# Patient Record
Sex: Female | Born: 1950 | Race: White | Hispanic: No | Marital: Married | Smoking: Never smoker
Health system: Southern US, Community
[De-identification: ages and names within clinical notes are randomized; demographics above are authoritative.]

## PROBLEM LIST (undated history)

## (undated) DIAGNOSIS — G709 Myoneural disorder, unspecified: Secondary | ICD-10-CM

## (undated) DIAGNOSIS — I2510 Atherosclerotic heart disease of native coronary artery without angina pectoris: Secondary | ICD-10-CM

## (undated) DIAGNOSIS — R209 Unspecified disturbances of skin sensation: Secondary | ICD-10-CM

## (undated) DIAGNOSIS — E559 Vitamin D deficiency, unspecified: Secondary | ICD-10-CM

## (undated) DIAGNOSIS — R7303 Prediabetes: Secondary | ICD-10-CM

## (undated) DIAGNOSIS — G2581 Restless legs syndrome: Secondary | ICD-10-CM

## (undated) DIAGNOSIS — C801 Malignant (primary) neoplasm, unspecified: Secondary | ICD-10-CM

## (undated) DIAGNOSIS — E039 Hypothyroidism, unspecified: Secondary | ICD-10-CM

## (undated) DIAGNOSIS — I700 Atherosclerosis of aorta: Secondary | ICD-10-CM

## (undated) DIAGNOSIS — Z9889 Other specified postprocedural states: Secondary | ICD-10-CM

## (undated) DIAGNOSIS — I10 Essential (primary) hypertension: Secondary | ICD-10-CM

## (undated) DIAGNOSIS — J45909 Unspecified asthma, uncomplicated: Secondary | ICD-10-CM

## (undated) DIAGNOSIS — K5730 Diverticulosis of large intestine without perforation or abscess without bleeding: Secondary | ICD-10-CM

## (undated) DIAGNOSIS — J309 Allergic rhinitis, unspecified: Secondary | ICD-10-CM

## (undated) DIAGNOSIS — K219 Gastro-esophageal reflux disease without esophagitis: Secondary | ICD-10-CM

## (undated) DIAGNOSIS — E785 Hyperlipidemia, unspecified: Secondary | ICD-10-CM

## (undated) HISTORY — DX: Diverticulosis of large intestine without perforation or abscess without bleeding: K57.30

## (undated) HISTORY — DX: Allergic rhinitis, unspecified: J30.9

## (undated) HISTORY — DX: Vitamin D deficiency, unspecified: E55.9

## (undated) HISTORY — DX: Unspecified disturbances of skin sensation: R20.9

## (undated) HISTORY — PX: WISDOM TOOTH EXTRACTION: SHX21

## (undated) HISTORY — DX: Unspecified asthma, uncomplicated: J45.909

## (undated) HISTORY — DX: Atherosclerotic heart disease of native coronary artery without angina pectoris: I25.10

## (undated) HISTORY — PX: COLONOSCOPY: SHX174

## (undated) HISTORY — DX: Essential (primary) hypertension: I10

## (undated) HISTORY — DX: Gastro-esophageal reflux disease without esophagitis: K21.9

## (undated) HISTORY — DX: Restless legs syndrome: G25.81

## (undated) HISTORY — DX: Hypothyroidism, unspecified: E03.9

## (undated) HISTORY — DX: Hyperlipidemia, unspecified: E78.5

## (undated) HISTORY — DX: Atherosclerosis of aorta: I70.0

---

## 1974-07-06 HISTORY — PX: TUBAL LIGATION: SHX77

## 1997-07-06 HISTORY — PX: TONSILLECTOMY AND ADENOIDECTOMY: SUR1326

## 1997-07-06 HISTORY — PX: THYROIDECTOMY: SHX17

## 1998-06-12 ENCOUNTER — Encounter: Admission: RE | Admit: 1998-06-12 | Discharge: 1998-06-12 | Payer: Self-pay | Admitting: *Deleted

## 1998-06-14 ENCOUNTER — Encounter: Payer: Self-pay | Admitting: Internal Medicine

## 1998-06-14 ENCOUNTER — Ambulatory Visit (HOSPITAL_COMMUNITY): Admission: RE | Admit: 1998-06-14 | Discharge: 1998-06-14 | Payer: Self-pay | Admitting: Internal Medicine

## 1998-07-15 ENCOUNTER — Ambulatory Visit (HOSPITAL_COMMUNITY): Admission: RE | Admit: 1998-07-15 | Discharge: 1998-07-16 | Payer: Self-pay | Admitting: *Deleted

## 2001-09-13 ENCOUNTER — Encounter: Admission: RE | Admit: 2001-09-13 | Discharge: 2001-09-13 | Payer: Self-pay | Admitting: Internal Medicine

## 2001-09-13 ENCOUNTER — Encounter: Payer: Self-pay | Admitting: Internal Medicine

## 2003-06-27 ENCOUNTER — Other Ambulatory Visit: Admission: RE | Admit: 2003-06-27 | Discharge: 2003-06-27 | Payer: Self-pay | Admitting: Internal Medicine

## 2004-04-01 ENCOUNTER — Ambulatory Visit (HOSPITAL_COMMUNITY): Admission: RE | Admit: 2004-04-01 | Discharge: 2004-04-01 | Payer: Self-pay | Admitting: Internal Medicine

## 2004-06-25 ENCOUNTER — Ambulatory Visit: Payer: Self-pay | Admitting: Internal Medicine

## 2004-07-02 ENCOUNTER — Ambulatory Visit: Payer: Self-pay | Admitting: Internal Medicine

## 2004-09-10 ENCOUNTER — Ambulatory Visit: Payer: Self-pay | Admitting: Gastroenterology

## 2004-09-18 ENCOUNTER — Ambulatory Visit: Payer: Self-pay | Admitting: Gastroenterology

## 2005-01-27 ENCOUNTER — Ambulatory Visit: Payer: Self-pay | Admitting: Internal Medicine

## 2005-05-11 ENCOUNTER — Ambulatory Visit: Payer: Self-pay | Admitting: Internal Medicine

## 2005-06-19 ENCOUNTER — Ambulatory Visit: Payer: Self-pay | Admitting: Internal Medicine

## 2005-06-25 ENCOUNTER — Encounter: Payer: Self-pay | Admitting: Internal Medicine

## 2005-06-25 ENCOUNTER — Ambulatory Visit: Payer: Self-pay | Admitting: Internal Medicine

## 2005-06-25 ENCOUNTER — Other Ambulatory Visit: Admission: RE | Admit: 2005-06-25 | Discharge: 2005-06-25 | Payer: Self-pay | Admitting: Internal Medicine

## 2005-08-11 ENCOUNTER — Ambulatory Visit (HOSPITAL_COMMUNITY): Admission: RE | Admit: 2005-08-11 | Discharge: 2005-08-11 | Payer: Self-pay | Admitting: Internal Medicine

## 2006-06-28 ENCOUNTER — Ambulatory Visit: Payer: Self-pay | Admitting: Internal Medicine

## 2006-06-28 LAB — CONVERTED CEMR LAB
Albumin: 3.5 g/dL (ref 3.5–5.2)
Alkaline Phosphatase: 99 units/L (ref 39–117)
Basophils Absolute: 0 10*3/uL (ref 0.0–0.1)
CO2: 32 meq/L (ref 19–32)
Calcium: 9 mg/dL (ref 8.4–10.5)
Chol/HDL Ratio, serum: 4.9
Glomerular Filtration Rate, Af Am: 95 mL/min/{1.73_m2}
Glucose, Bld: 104 mg/dL — ABNORMAL HIGH (ref 70–99)
LDL Cholesterol: 125 mg/dL — ABNORMAL HIGH (ref 0–99)
Lymphocytes Relative: 22.5 % (ref 12.0–46.0)
MCHC: 34.5 g/dL (ref 30.0–36.0)
Monocytes Absolute: 0.4 10*3/uL (ref 0.2–0.7)
Monocytes Relative: 8.5 % (ref 3.0–11.0)
Neutro Abs: 2.9 10*3/uL (ref 1.4–7.7)
Platelets: 194 10*3/uL (ref 150–400)
Potassium: 3.9 meq/L (ref 3.5–5.1)
TSH: 0.88 microintl units/mL (ref 0.35–5.50)
Total Protein: 6.7 g/dL (ref 6.0–8.3)

## 2006-07-05 ENCOUNTER — Ambulatory Visit: Payer: Self-pay | Admitting: Internal Medicine

## 2006-08-12 ENCOUNTER — Ambulatory Visit (HOSPITAL_COMMUNITY): Admission: RE | Admit: 2006-08-12 | Discharge: 2006-08-12 | Payer: Self-pay | Admitting: Internal Medicine

## 2007-06-29 ENCOUNTER — Ambulatory Visit: Payer: Self-pay | Admitting: Internal Medicine

## 2007-06-29 LAB — CONVERTED CEMR LAB
ALT: 34 units/L (ref 0–35)
Alkaline Phosphatase: 106 units/L (ref 39–117)
Basophils Relative: 0.1 % (ref 0.0–1.0)
Bilirubin Urine: NEGATIVE
Bilirubin, Direct: 0.2 mg/dL (ref 0.0–0.3)
CO2: 31 meq/L (ref 19–32)
Creatinine, Ser: 0.7 mg/dL (ref 0.4–1.2)
Eosinophils Relative: 2.7 % (ref 0.0–5.0)
GFR calc Af Amer: 111 mL/min
Glucose, Bld: 105 mg/dL — ABNORMAL HIGH (ref 70–99)
HCT: 40.5 % (ref 36.0–46.0)
Hemoglobin: 13.9 g/dL (ref 12.0–15.0)
Lymphocytes Relative: 21.6 % (ref 12.0–46.0)
Monocytes Absolute: 0.5 10*3/uL (ref 0.2–0.7)
Neutro Abs: 3.7 10*3/uL (ref 1.4–7.7)
Nitrite: NEGATIVE
Potassium: 4.1 meq/L (ref 3.5–5.1)
RDW: 12 % (ref 11.5–14.6)
TSH: 0.7 microintl units/mL (ref 0.35–5.50)
Total Bilirubin: 0.8 mg/dL (ref 0.3–1.2)
Total CHOL/HDL Ratio: 5.6
Total Protein: 7 g/dL (ref 6.0–8.3)
Urobilinogen, UA: 0.2
VLDL: 29 mg/dL (ref 0–40)
WBC Urine, dipstick: NEGATIVE
WBC: 5.5 10*3/uL (ref 4.5–10.5)

## 2007-07-06 DIAGNOSIS — K5730 Diverticulosis of large intestine without perforation or abscess without bleeding: Secondary | ICD-10-CM

## 2007-07-06 DIAGNOSIS — I10 Essential (primary) hypertension: Secondary | ICD-10-CM

## 2007-07-06 DIAGNOSIS — E039 Hypothyroidism, unspecified: Secondary | ICD-10-CM

## 2007-07-06 DIAGNOSIS — K219 Gastro-esophageal reflux disease without esophagitis: Secondary | ICD-10-CM

## 2007-07-06 HISTORY — DX: Essential (primary) hypertension: I10

## 2007-07-06 HISTORY — DX: Hypothyroidism, unspecified: E03.9

## 2007-07-06 HISTORY — DX: Gastro-esophageal reflux disease without esophagitis: K21.9

## 2007-07-06 HISTORY — DX: Diverticulosis of large intestine without perforation or abscess without bleeding: K57.30

## 2007-07-11 ENCOUNTER — Ambulatory Visit: Payer: Self-pay | Admitting: Internal Medicine

## 2007-07-11 DIAGNOSIS — J309 Allergic rhinitis, unspecified: Secondary | ICD-10-CM

## 2007-07-11 DIAGNOSIS — J45909 Unspecified asthma, uncomplicated: Secondary | ICD-10-CM | POA: Insufficient documentation

## 2007-07-11 HISTORY — DX: Allergic rhinitis, unspecified: J30.9

## 2007-08-11 ENCOUNTER — Ambulatory Visit: Payer: Self-pay | Admitting: Internal Medicine

## 2007-08-11 DIAGNOSIS — T887XXA Unspecified adverse effect of drug or medicament, initial encounter: Secondary | ICD-10-CM | POA: Insufficient documentation

## 2007-09-06 ENCOUNTER — Ambulatory Visit (HOSPITAL_COMMUNITY): Admission: RE | Admit: 2007-09-06 | Discharge: 2007-09-06 | Payer: Self-pay | Admitting: Internal Medicine

## 2007-10-10 ENCOUNTER — Ambulatory Visit: Payer: Self-pay | Admitting: Internal Medicine

## 2007-10-20 ENCOUNTER — Encounter: Payer: Self-pay | Admitting: Internal Medicine

## 2008-07-31 ENCOUNTER — Ambulatory Visit: Payer: Self-pay | Admitting: Internal Medicine

## 2008-07-31 LAB — CONVERTED CEMR LAB
ALT: 34 units/L (ref 0–35)
AST: 31 units/L (ref 0–37)
Albumin: 3.7 g/dL (ref 3.5–5.2)
Alkaline Phosphatase: 88 units/L (ref 39–117)
BUN: 15 mg/dL (ref 6–23)
Basophils Relative: 0.8 % (ref 0.0–3.0)
CO2: 28 meq/L (ref 19–32)
Chloride: 104 meq/L (ref 96–112)
Creatinine, Ser: 0.8 mg/dL (ref 0.4–1.2)
Eosinophils Absolute: 0.1 10*3/uL (ref 0.0–0.7)
Eosinophils Relative: 2.7 % (ref 0.0–5.0)
GFR calc non Af Amer: 78 mL/min
Glucose, Bld: 98 mg/dL (ref 70–99)
Glucose, Urine, Semiquant: NEGATIVE
HDL: 37.8 mg/dL — ABNORMAL LOW (ref >39.0)
LDL Cholesterol: 126 mg/dL — ABNORMAL HIGH (ref 0–99)
Lymphocytes Relative: 27.8 % (ref 12.0–46.0)
MCV: 91.2 fL (ref 78.0–100.0)
Monocytes Relative: 8.3 % (ref 3.0–12.0)
Neutrophils Relative %: 60.4 % (ref 43.0–77.0)
Nitrite: NEGATIVE
Platelets: 158 10*3/uL (ref 150–400)
Potassium: 3.4 meq/L — ABNORMAL LOW (ref 3.5–5.1)
RBC: 4.14 M/uL (ref 3.87–5.11)
TSH: 0.79 microintl units/mL (ref 0.35–5.50)
Total CHOL/HDL Ratio: 5.1
VLDL: 29 mg/dL (ref 0–40)
WBC Urine, dipstick: NEGATIVE
WBC: 4.2 10*3/uL — ABNORMAL LOW (ref 4.5–10.5)
pH: 7

## 2008-08-07 ENCOUNTER — Other Ambulatory Visit: Admission: RE | Admit: 2008-08-07 | Discharge: 2008-08-07 | Payer: Self-pay | Admitting: Internal Medicine

## 2008-08-07 ENCOUNTER — Encounter: Payer: Self-pay | Admitting: Internal Medicine

## 2008-08-07 ENCOUNTER — Ambulatory Visit: Payer: Self-pay | Admitting: Internal Medicine

## 2008-08-07 DIAGNOSIS — G2581 Restless legs syndrome: Secondary | ICD-10-CM

## 2008-08-07 DIAGNOSIS — R51 Headache: Secondary | ICD-10-CM

## 2008-08-07 DIAGNOSIS — R519 Headache, unspecified: Secondary | ICD-10-CM | POA: Insufficient documentation

## 2008-08-07 HISTORY — DX: Restless legs syndrome: G25.81

## 2008-08-09 ENCOUNTER — Telehealth (INDEPENDENT_AMBULATORY_CARE_PROVIDER_SITE_OTHER): Payer: Self-pay | Admitting: *Deleted

## 2008-09-20 ENCOUNTER — Ambulatory Visit (HOSPITAL_COMMUNITY): Admission: RE | Admit: 2008-09-20 | Discharge: 2008-09-20 | Payer: Self-pay | Admitting: Internal Medicine

## 2008-12-11 ENCOUNTER — Ambulatory Visit: Payer: Self-pay | Admitting: Internal Medicine

## 2009-05-09 ENCOUNTER — Telehealth: Payer: Self-pay | Admitting: Internal Medicine

## 2009-06-17 ENCOUNTER — Encounter: Payer: Self-pay | Admitting: Internal Medicine

## 2009-08-19 ENCOUNTER — Ambulatory Visit: Payer: Self-pay | Admitting: Internal Medicine

## 2009-08-19 LAB — CONVERTED CEMR LAB
ALT: 39 units/L — ABNORMAL HIGH (ref 0–35)
AST: 36 units/L (ref 0–37)
Albumin: 4.1 g/dL (ref 3.5–5.2)
BUN: 16 mg/dL (ref 6–23)
Chloride: 107 meq/L (ref 96–112)
Direct LDL: 142.2 mg/dL
Eosinophils Relative: 3.4 % (ref 0.0–5.0)
Glucose, Bld: 89 mg/dL (ref 70–99)
Glucose, Urine, Semiquant: NEGATIVE
HCT: 39.4 % (ref 36.0–46.0)
Hemoglobin: 13.2 g/dL (ref 12.0–15.0)
Lymphs Abs: 1.4 10*3/uL (ref 0.7–4.0)
MCV: 94.3 fL (ref 78.0–100.0)
Monocytes Absolute: 0.4 10*3/uL (ref 0.1–1.0)
Neutro Abs: 2.7 10*3/uL (ref 1.4–7.7)
Nitrite: NEGATIVE
Platelets: 154 10*3/uL (ref 150.0–400.0)
Potassium: 4.5 meq/L (ref 3.5–5.1)
RDW: 12.3 % (ref 11.5–14.6)
Sodium: 145 meq/L (ref 135–145)
Specific Gravity, Urine: >=1.03
TSH: 0.41 microintl units/mL (ref 0.35–5.50)
Total Bilirubin: 0.5 mg/dL (ref 0.3–1.2)
WBC Urine, dipstick: NEGATIVE
WBC: 4.8 10*3/uL (ref 4.5–10.5)
pH: 5.5

## 2009-08-26 ENCOUNTER — Ambulatory Visit: Payer: Self-pay | Admitting: Internal Medicine

## 2010-02-13 ENCOUNTER — Ambulatory Visit: Payer: Self-pay | Admitting: Family Medicine

## 2010-02-13 DIAGNOSIS — R209 Unspecified disturbances of skin sensation: Secondary | ICD-10-CM | POA: Insufficient documentation

## 2010-02-13 DIAGNOSIS — M7660 Achilles tendinitis, unspecified leg: Secondary | ICD-10-CM

## 2010-02-13 HISTORY — DX: Unspecified disturbances of skin sensation: R20.9

## 2010-02-27 ENCOUNTER — Ambulatory Visit (HOSPITAL_COMMUNITY): Admission: RE | Admit: 2010-02-27 | Discharge: 2010-02-27 | Payer: Self-pay | Admitting: Internal Medicine

## 2010-02-27 LAB — HM MAMMOGRAPHY: HM Mammogram: NEGATIVE

## 2010-03-26 ENCOUNTER — Telehealth: Payer: Self-pay | Admitting: Internal Medicine

## 2010-07-27 ENCOUNTER — Encounter: Payer: Self-pay | Admitting: Internal Medicine

## 2010-08-07 NOTE — Progress Notes (Signed)
Summary: [facility] called req clarification on Indomethacin  Phone Note From Pharmacy Call back at [phone] [facility]   Caller: [facility] [address] Summary of Call: Need clarification on Indomethacin 25mg  caps as needed.  Initial call taken by: Lucy Antigua,  March 26, 2010 9:49 AM  Follow-up for Phone Call        spoke with pharmacy - gave new sig. KIK  med list changed Follow-up by: Duard Brady LPN,  March 26, 2010 10:06 AM    New/Updated Medications: INDOMETHACIN 25 MG CAPS (INDOMETHACIN) 1 by mouth once daily or two times a day as needed pain

## 2010-08-07 NOTE — Assessment & Plan Note (Signed)
Summary: not feeling well//ccm   Vital Signs:  Patient profile:   60 year old female Weight:      194 pounds O2 Sat:      97 % on Room air Temp:     98.6 degrees F oral Pulse rate:   78 / minute BP sitting:   120 / 80  (left arm) Cuff size:   regular  Vitals Entered By: Romualdo Bolk, CMA (AAMA) (February 13, 2010 11:35 AM)  O2 Flow:  Room air CC: Left ankle swelling for a few months, fatigue, sob and arms going numb. Pt states that she is not having any chest pain.   History of Present Illness: Patient seen for same day appt with chief complaint of left ankle swelling for past few months. She just returned from [place] and did more walking than usual. Minimal if any discomfort other than around the Achilles region. No specific injury. No weakness in the foot or ankle. She has not tried icing. Not taking any medications. Aggravated by prolonged standing or walking.  she has noticed some swelling both on the medial and lateral aspect of the ankle.  Patient complains of some intermittent numbness involving both upper extremities. This occurs almost exclusively at night and usually is very transient and improves after position change. No associated weakness. No neck pain.    Preventive Screening-Counseling & Management  Alcohol-Tobacco     Smoking Status: never  Current Medications (verified): 1)  Synthroid 50 Mcg  Tabs (Levothyroxine Sodium) .Marland Kitchen.. 1 Once Daily 2)  Indomethacin 25 Mg Caps (Indomethacin) .... Prn 3)  Hydrochlorothiazide 25 Mg Tabs (Hydrochlorothiazide) .... One Daily  Allergies (verified): 1)  ! Codeine Phosphate (Codeine Phosphate)  Past History:  Past Medical History: Last updated: 07/11/2007 Hypertension Hypothyroidism Restless legs Diverticulosis, colon GERD Allergic rhinitis Asthma  Past Surgical History: Last updated: 08/26/2009 Tubal ligation Thyroidectomy, partail colonoscopy  2007  Social History: Last updated: 07/11/2007 one son one  daughter, 5 grandchildren PMH reviewed for relevance, PSH reviewed for relevance, SH/Risk Factors reviewed for relevance  Review of Systems  The patient denies anorexia, fever, weight loss, weight gain, dyspnea on exertion, prolonged cough, muscle weakness, and difficulty walking.    Physical Exam  General:  Well-developed,well-nourished,in no acute distress; alert,appropriate and cooperative throughout examination Mouth:  Oral mucosa and oropharynx without lesions or exudates.  Teeth in good repair. Neck:  No deformities, masses, or tenderness noted. Lungs:  Normal respiratory effort, chest expands symmetrically. Lungs are clear to auscultation, no crackles or wheezes. Heart:  Normal rate and regular rhythm. S1 and S2 normal without gallop, murmur, click, rub or other extra sounds. Extremities:  left ankle reveals some mild swelling around the distal Achilles region but tendon fully intact. She has full range of motion with plantar flexion and dorsiflexion. She has some slight soft tissue swelling but none tenderness posterior to the medial or lateral malleolus. No erythema or warmth. No bony tenderness in the foot. Good distal foot pulses Neurologic:  full strength with plantar and dorsiflexion.alert & oriented X3, cranial nerves II-XII intact, strength normal in all extremities, sensation intact to light touch, and DTRs symmetrical and normal.   Skin:  no rashes.     Impression & Recommendations:  Problem # 1:  ACHILLES TENDINITIS (ICD-726.71) Assessment New we've recommended icing, over-the-counter anti-inflammatories and stretches given. Consider elastic sleeve for compression if swelling persist.  Problem # 2:  PARESTHESIA (ICD-782.0) Assessment: New suspect this is positional since symptoms very transient at night.  No associated weakness. Reassurance given.  Complete Medication List: 1)  Synthroid 50 Mcg Tabs (Levothyroxine sodium) .Marland Kitchen.. 1 once daily 2)  Indomethacin 25 Mg Caps  (Indomethacin) .... Prn 3)  Hydrochlorothiazide 25 Mg Tabs (Hydrochlorothiazide) .... One daily  Patient Instructions: 1)  Ice L ankle 2-3 times daily if possible. 2)  Achilles stretches as instructed. 3)  Elevated feet frequently. 4)  Consider elastic brace to L ankle.

## 2010-08-07 NOTE — Assessment & Plan Note (Signed)
Summary: CPX // RS   Vital Signs:  Patient profile:   60 year old female Height:      63 inches Weight:      192 pounds BMI:     34.13 Temp:     98.4 degrees F oral BP sitting:   120 / 70  (right arm) Cuff size:   regular  Vitals Entered By: Duard Brady LPN (August 26, 2009 1:17 PM) CC: CPX - doing ok  Is Patient Diabetic? No   CC:  CPX - doing ok .  History of Present Illness: 60 -year-old patient who is seen today for a preventive health examination.  Medical comes include mild hypertension.  She has a history of asthma, allergic rhinitis, and gastroesophageal reflux disease.  She has restless leg syndrome.  [facility] headaches, which have improved.  No new concerns or complaints today.  Laboratory studies reviewed.  She does complain of some mild, but persistent cough for several months.  She is on ACE inhibition.  Preventive Screening-Counseling & Management  Alcohol-Tobacco     Smoking Status: never  Allergies: 1)  ! Codeine Phosphate (Codeine Phosphate)  Past History:  Past Medical History: Reviewed history from 07/11/2007 and no changes required. Hypertension Hypothyroidism Restless legs Diverticulosis, colon GERD Allergic rhinitis Asthma  Past Surgical History: Tubal ligation Thyroidectomy, partail colonoscopy  2007  Family History: Reviewed history from 07/11/2007 and no changes required. father age 18 in good health.  Mother died at 60, heart failure, history of COPD, maternal aunt had breast cancer.  Maternal uncle lung cancer  Two brothers, one with a history of hypertension  Social History: Reviewed history from 07/11/2007 and no changes required. one son one daughter, 5 grandchildrenSmoking Status:  never  Review of Systems       The patient complains of prolonged cough.  The patient denies anorexia, fever, weight loss, weight gain, vision loss, decreased hearing, hoarseness, chest pain, syncope,  dyspnea on exertion, peripheral edema, headaches, hemoptysis, abdominal pain, melena, hematochezia, severe indigestion/heartburn, hematuria, incontinence, genital sores, muscle weakness, suspicious skin lesions, transient blindness, difficulty walking, depression, unusual weight change, abnormal bleeding, enlarged lymph nodes, angioedema, and breast masses.    Physical Exam  General:  overweight-appearing.  110/70overweight-appearing.   Head:  Normocephalic and atraumatic without obvious abnormalities. No apparent alopecia or balding. Eyes:  No corneal or conjunctival inflammation noted. EOMI. Perrla. Funduscopic exam benign, without hemorrhages, exudates or papilledema. Vision grossly normal. Ears:  External ear exam shows no significant lesions or deformities.  Otoscopic examination reveals clear canals, tympanic membranes are intact bilaterally without bulging, retraction, inflammation or discharge. Hearing is grossly normal bilaterally. Nose:  External nasal examination shows no deformity or inflammation. Nasal mucosa are pink and moist without lesions or exudates. Mouth:  Oral mucosa and oropharynx without lesions or exudates.  Teeth in good repair. Neck:  No deformities, masses, or tenderness noted. Chest Wall:  No deformities, masses, or tenderness noted. Breasts:  No mass, nodules, thickening, tenderness, bulging, retraction, inflamation, nipple discharge or skin changes noted.   Lungs:  Normal respiratory effort, chest expands symmetrically. Lungs are clear to auscultation, no crackles or wheezes. Heart:  Normal rate and regular rhythm. S1 and S2 normal without gallop, murmur, click, rub or other extra sounds. Rectal:  No external abnormalities noted. Normal sphincter tone. No rectal masses or tenderness. Genitalia:  Normal introitus for age, no external lesions, no vaginal discharge, mucosa pink and moist, no vaginal or cervical lesions,  no vaginal atrophy, no friaility or hemorrhage,  normal uterus size and position, no adnexal masses or tenderness Msk:  No deformity or scoliosis noted of thoracic or lumbar spine.   Pulses:  the right posterior tibial pulse was diminished Extremities:  No clubbing, cyanosis, edema, or deformity noted with normal full range of motion of all joints.   Neurologic:  No cranial nerve deficits noted. Station and gait are normal. Plantar reflexes are down-going bilaterally. DTRs are symmetrical throughout. Sensory, motor and coordinative functions appear intact. Skin:  Intact without suspicious lesions or rashes Cervical Nodes:  No lymphadenopathy noted Axillary Nodes:  No palpable lymphadenopathy Inguinal Nodes:  No significant adenopathy Psych:  Cognition and judgment appear intact. Alert and cooperative with normal attention span and concentration. No apparent delusions, illusions, hallucinations   Impression & Recommendations:  Problem # 1:  HEADACHE (ICD-784.0)  Her updated medication list for this problem includes:    Indomethacin 25 Mg Caps (Indomethacin) .Marland Kitchen... Prn  Orders: EKG w/ Interpretation (93000)  Her updated medication list for this problem includes:    Indomethacin 25 Mg Caps (Indomethacin) .Marland Kitchen... Prn  Problem # 2:  RESTLESS LEG SYNDROME (ICD-333.94)  Problem # 3:  GERD (ICD-530.81)  Problem # 4:  ASTHMA (ICD-493.90)  Complete Medication List: 1)  Synthroid 50 Mcg Tabs (Levothyroxine sodium) .Marland Kitchen.. 1 once daily 2)  Requip 1 Mg Tabs (Ropinirole hcl) .Marland Kitchen.. 1 at bedtime 3)  Indomethacin 25 Mg Caps (Indomethacin) .... Prn 4)  Hydrochlorothiazide 25 Mg Tabs (Hydrochlorothiazide) .... One daily  Patient Instructions: 1)  Limit your Sodium (Salt). 2)  It is important that you exercise regularly at least 20 minutes 5 times a week. If you develop chest pain, have severe difficulty breathing, or feel very tired , stop exercising immediately and seek medical attention. 3)  You need to lose weight. Consider a lower calorie diet  and regular exercise.  4)  Check your Blood Pressure regularly. If it is above:  150/90 you should make an appointment. Prescriptions: HYDROCHLOROTHIAZIDE 25 MG TABS (HYDROCHLOROTHIAZIDE) one daily  #90 x 6   Entered and Authorized by:   Gordy Savers  MD   Signed by:   Gordy Savers  MD on 08/26/2009   Method used:   Print then Give to Patient   RxID:   [id number] INDOMETHACIN 25 MG CAPS (INDOMETHACIN) prn  #50 x 3   Entered and Authorized by:   Gordy Savers  MD   Signed by:   Gordy Savers  MD on 08/26/2009   Method used:   Print then Give to Patient   RxID:   [id number] REQUIP 1 MG  TABS (ROPINIROLE HCL) 1 at bedtime  #90 x 6   Entered and Authorized by:   Gordy Savers  MD   Signed by:   Gordy Savers  MD on 08/26/2009   Method used:   Print then Give to Patient   RxID:   [id number] SYNTHROID 50 MCG  TABS (LEVOTHYROXINE SODIUM) 1 once daily  #90 x 6   Entered and Authorized by:   Gordy Savers  MD   Signed by:   Gordy Savers  MD on 08/26/2009   Method used:   Print then Give to Patient   RxID:   [id number]

## 2010-09-30 ENCOUNTER — Other Ambulatory Visit: Payer: Self-pay

## 2010-10-07 ENCOUNTER — Other Ambulatory Visit (INDEPENDENT_AMBULATORY_CARE_PROVIDER_SITE_OTHER): Payer: BC Managed Care – PPO | Admitting: Internal Medicine

## 2010-10-07 ENCOUNTER — Encounter: Payer: Self-pay | Admitting: Internal Medicine

## 2010-10-07 DIAGNOSIS — Z0000 Encounter for general adult medical examination without abnormal findings: Secondary | ICD-10-CM

## 2010-10-07 LAB — POCT URINALYSIS DIPSTICK
Glucose, UA: NEGATIVE
Nitrite, UA: NEGATIVE
Protein, UA: NEGATIVE
Spec Grav, UA: 1.015
Urobilinogen, UA: 0.2

## 2010-10-07 LAB — CBC WITH DIFFERENTIAL/PLATELET
Basophils Relative: 0.6 % (ref 0.0–3.0)
Eosinophils Relative: 4.2 % (ref 0.0–5.0)
Lymphocytes Relative: 26.8 % (ref 12.0–46.0)
MCV: 90.8 fl (ref 78.0–100.0)
Monocytes Absolute: 0.4 10*3/uL (ref 0.1–1.0)
Monocytes Relative: 9.2 % (ref 3.0–12.0)
Neutrophils Relative %: 59.2 % (ref 43.0–77.0)
Platelets: 150 10*3/uL (ref 150.0–400.0)
RBC: 4.22 Mil/uL (ref 3.87–5.11)
WBC: 4.3 10*3/uL — ABNORMAL LOW (ref 4.5–10.5)

## 2010-10-08 ENCOUNTER — Encounter: Payer: Self-pay | Admitting: Internal Medicine

## 2010-10-08 LAB — HEPATIC FUNCTION PANEL
ALT: 39 U/L — ABNORMAL HIGH (ref 0–35)
AST: 34 U/L (ref 0–37)
Alkaline Phosphatase: 88 U/L (ref 39–117)
Bilirubin, Direct: 0.1 mg/dL (ref 0.0–0.3)
Total Bilirubin: 0.4 mg/dL (ref 0.3–1.2)
Total Protein: 6.7 g/dL (ref 6.0–8.3)

## 2010-10-08 LAB — LIPID PANEL
LDL Cholesterol: 122 mg/dL — ABNORMAL HIGH (ref 0–99)
Total CHOL/HDL Ratio: 5
Triglycerides: 135 mg/dL (ref 0.0–149.0)

## 2010-10-08 LAB — BASIC METABOLIC PANEL
BUN: 13 mg/dL (ref 6–23)
Calcium: 9.2 mg/dL (ref 8.4–10.5)
Chloride: 105 mEq/L (ref 96–112)
Creatinine, Ser: 0.6 mg/dL (ref 0.4–1.2)
GFR: 100.06 mL/min (ref >60.00)

## 2010-10-08 LAB — TSH: TSH: 0.46 u[IU]/mL (ref 0.35–5.50)

## 2010-10-14 ENCOUNTER — Ambulatory Visit (INDEPENDENT_AMBULATORY_CARE_PROVIDER_SITE_OTHER): Payer: BC Managed Care – PPO | Admitting: Internal Medicine

## 2010-10-14 ENCOUNTER — Encounter: Payer: Self-pay | Admitting: Internal Medicine

## 2010-10-14 DIAGNOSIS — K219 Gastro-esophageal reflux disease without esophagitis: Secondary | ICD-10-CM

## 2010-10-14 DIAGNOSIS — E039 Hypothyroidism, unspecified: Secondary | ICD-10-CM

## 2010-10-14 DIAGNOSIS — R079 Chest pain, unspecified: Secondary | ICD-10-CM

## 2010-10-14 DIAGNOSIS — I10 Essential (primary) hypertension: Secondary | ICD-10-CM

## 2010-10-14 DIAGNOSIS — R0602 Shortness of breath: Secondary | ICD-10-CM

## 2010-10-14 DIAGNOSIS — Z0000 Encounter for general adult medical examination without abnormal findings: Secondary | ICD-10-CM

## 2010-10-14 MED ORDER — LEVOTHYROXINE SODIUM 50 MCG PO TABS: 50.0000 ug | ORAL_TABLET | Freq: Every day | ORAL | Status: DC

## 2010-10-14 MED ORDER — HYDROCHLOROTHIAZIDE 25 MG PO TABS: 25.0000 mg | ORAL_TABLET | Freq: Every day | ORAL | Status: DC

## 2010-10-14 NOTE — Patient Instructions (Signed)
Limit your sodium (Salt) intake  Avoids foods high in acid such as tomatoes citrus juices, and spicy foods.  Avoid eating within two hours of lying down or before exercising.  Do not overheat.  Try smaller more frequent meals.  If symptoms persist, elevate the head of her bed 12 inches while sleeping.  Please check your blood pressure on a regular basis.  If it is consistently greater than 150/90, please make an office appointment.  You need to lose weight.  Consider a lower calorie diet and regular exercise.  Return in 6 months for follow-up  Take a calcium supplement, plus [phone] units of vitamin D

## 2010-10-14 NOTE — Progress Notes (Signed)
Subjective:    Patient ID: Karen Cordova, female    DOB: 24-Jun-1951, 60 y.o.   MRN: [id number]  HPI  60 year old patient who is seen today for an annual examination. She has treated hypertension controlled on diuretic therapy. She does monitor home blood pressure is occasionally with last readings. Complaints today include some left lateral ankle pain this has been present for some months. She does walk daily and it does not interfere with her activities. She does describe some dyspnea on exertion when she walks up hills. Denies any exertional chest pain. She has treated hypothyroidism. She also describes some reflux symptoms but has not tried any medications. An anti-reflux regimen discussed today.    Summary: CPX // RS  Vital Signs:  Patient profile: 60 year old female  Height: 63 inches  Weight: 192 pounds  BMI: 34.13  Temp: 98.4 degrees F oral  BP sitting: 120 / 70 (right arm)  Cuff size: regular  Vitals Entered By: Duard Brady LPN (August 26, 2009 1:17 PM)  CC: CPX - doing ok  Is Patient Diabetic? No  CC: CPX - doing ok .  History of Present Illness:  33 -year-old patient who is seen today for a preventive health examination. Medical comes include mild hypertension. She has a history of asthma, allergic rhinitis, and gastroesophageal reflux disease. She has restless leg syndrome. [facility] for headaches, which have improved. No new concerns or complaints today. Laboratory studies reviewed. She does complain of some mild, but persistent cough for several months. She is on ACE inhibition.  Preventive Screening-Counseling & Management  Alcohol-Tobacco  Smoking Status: never  Allergies:  1) ! Codeine Phosphate (Codeine Phosphate)  Past History:  Past Medical History:  Reviewed history from 07/11/2007 and no changes required.  Hypertension  Hypothyroidism  Restless legs  Diverticulosis, colon  GERD  Allergic rhinitis  Asthma  Past Surgical  History:  Tubal ligation  Thyroidectomy, partail  colonoscopy 2007  Family History:  Reviewed history from 07/11/2007 and no changes required.  father age 69 in good health. Mother died at 22, heart failure, history of COPD, maternal aunt had breast cancer. Maternal uncle lung cancer  Two brothers, one with a history of hypertension  Social History:  Reviewed history from 07/11/2007 and no changes required.  one son one daughter, 5 grandchildrenSmoking Status: never  Review of Systems  The patient complains of prolonged cough. The patient denies anorexia, fever, weight loss, weight gain, vision loss, decreased hearing, hoarseness, chest pain, syncope, dyspnea on exertion, peripheral edema, headaches, hemoptysis, abdominal pain, melena, hematochezia, severe indigestion/heartburn, hematuria, incontinence, genital sores, muscle weakness, suspicious skin lesions, transient blindness, difficulty walking, depression, unusual weight change, abnormal bleeding, enlarged lymph nodes, angioedema, and breast masses.    Past Medical History  Diagnosis Date  . ALLERGIC RHINITIS 07/11/2007  . ASTHMA 07/11/2007  . DIVERTICULOSIS, COLON 07/06/2007  . GERD 07/06/2007  . HYPERTENSION 07/06/2007  . HYPOTHYROIDISM 07/06/2007  . PARESTHESIA 02/13/2010  . RESTLESS LEG SYNDROME 08/07/2008   Past Surgical History  Procedure Date  . Tubal ligation   . Thyroidectomy     partial    reports that she has never smoked. She has never used smokeless tobacco. She reports that she does not drink alcohol or use illicit drugs. family history is not on file. Allergies  Allergen Reactions  . Codeine Phosphate      Review of Systems  Constitutional: Negative for fever, appetite change, fatigue and unexpected weight change.  HENT: Negative for hearing loss, ear pain, nosebleeds, congestion, sore throat, mouth sores, trouble swallowing, neck stiffness, dental problem, voice change, sinus pressure and tinnitus.   Eyes:  Negative for photophobia, pain, redness and visual disturbance.  Respiratory: Negative for cough, chest tightness and shortness of breath.   Cardiovascular: Negative for chest pain, palpitations and leg swelling.  Gastrointestinal: Negative for nausea, vomiting, abdominal pain, diarrhea, constipation, blood in stool, abdominal distention and rectal pain.  Genitourinary: Negative for dysuria, urgency, frequency, hematuria, flank pain, vaginal bleeding, vaginal discharge, difficulty urinating, genital sores, vaginal pain, menstrual problem and pelvic pain.  Musculoskeletal: Negative for back pain and arthralgias.  Skin: Negative for rash.  Neurological: Negative for dizziness, syncope, speech difficulty, weakness, light-headedness, numbness and headaches.  Hematological: Negative for adenopathy. Does not bruise/bleed easily.  Psychiatric/Behavioral: Negative for suicidal ideas, behavioral problems, self-injury, dysphoric mood and agitation. The patient is not nervous/anxious.        Objective:   Physical Exam  Constitutional: She is oriented to person, place, and time. She appears well-developed and well-nourished.  HENT:  Head: Normocephalic and atraumatic.  Right Ear: External ear normal.  Left Ear: External ear normal.  Mouth/Throat: Oropharynx is clear and moist.  Eyes: Conjunctivae and EOM are normal.  Neck: Normal range of motion. Neck supple. No JVD present. No thyromegaly present.  Cardiovascular: Normal rate, regular rhythm, normal heart sounds and intact distal pulses.   No murmur heard. Pulmonary/Chest: Effort normal and breath sounds normal. She has no wheezes. She has no rales.  Abdominal: Soft. Bowel sounds are normal. She exhibits no distension and no mass. There is no tenderness. There is no rebound and no guarding.  Genitourinary: Vagina normal and uterus normal. Guaiac negative stool. No vaginal discharge found.  Musculoskeletal: Normal range of motion. She exhibits no  edema and no tenderness.       Minimal swelling involving her left lateral ankle  Neurological: She is alert and oriented to person, place, and time. She has normal reflexes. No cranial nerve deficit. She exhibits normal muscle tone. Coordination normal.  Skin: Skin is warm and dry. No rash noted.  Psychiatric: She has a normal mood and affect. Her behavior is normal.          Assessment & Plan:    Annual physical examination Mild exogenous obesity Gastroesophageal reflux disease. We'll place on a antireflux regimen and treat for 30 days with Nexium Hypertension stable weight loss source or to diet encouraged we'll continue to monitor at home

## 2010-11-21 NOTE — Assessment & Plan Note (Signed)
[facility] OFFICE NOTE   Karen Cordova, Karen Cordova                        MRN:          [id number]  DATE:07/05/2006                            DOB:          12/18/1950    A 60 year old female seen today for a wellness exam. She has a history  of hypertension, hypothyroidism, restless leg syndrome. Her main  complaint is some insomnia issues. She is status post thyroidectomy in  2000 and is on Synthroid replacement medication. There is a remote  history of asthma, which has been stable.   REVIEW OF SYSTEMS:  The patient did have a colonoscopy in 06 that  revealed mild diverticular disease. Family history is  unchanged. Both  brothers have hypertension.   EXAMINATION:  Revealed an overweight  healthy appearing female in no  acute distress. Blood pressure 140/92.  FUNDI:  Unremarkable.  EARS, NOSE, AND THROAT:  Clear.  NECK:  No bruits or adenopathy.  CHEST:  Clear.  BREASTS:  Negative.  CARDIOVASCULAR EXAM:  Normal heart sounds, no murmurs.  ABDOMEN:  Obese, soft, but nontender. No organomegaly.  EXTREMITIES:  Negative. Peripheral pulses were full.  PELVIC EXAM:  Revealed normal cervix, no adnexal masses.  RECTAL EXAM:  Heme negative.   IMPRESSION:  Hypertension, hypothyroidism, restless leg syndrome,  insomnia.   DISPOSITION:  We will give a trial of Rozerem. Her Maxzide will be  discontinued and will be placed on benazepril, hydrochlorothiazide, and  we will recheck in 3 months. A mammogram incurs.     Gordy Savers, MD  Electronically Signed    PFK/MedQ  DD: 07/05/2006  DT: 07/05/2006  Job #: [id number]

## 2011-05-13 ENCOUNTER — Other Ambulatory Visit: Payer: Self-pay | Admitting: Internal Medicine

## 2011-05-13 DIAGNOSIS — Z1231 Encounter for screening mammogram for malignant neoplasm of breast: Secondary | ICD-10-CM

## 2011-06-10 ENCOUNTER — Ambulatory Visit (HOSPITAL_COMMUNITY)
Admission: RE | Admit: 2011-06-10 | Discharge: 2011-06-10 | Disposition: A | Discharge status: Home / Self Care | Payer: BC Managed Care – PPO | Source: Ambulatory Visit | Attending: Internal Medicine | Admitting: Internal Medicine

## 2011-06-10 DIAGNOSIS — Z1231 Encounter for screening mammogram for malignant neoplasm of breast: Secondary | ICD-10-CM | POA: Insufficient documentation

## 2011-08-12 ENCOUNTER — Encounter: Payer: Self-pay | Admitting: Gastroenterology

## 2011-10-29 ENCOUNTER — Other Ambulatory Visit (INDEPENDENT_AMBULATORY_CARE_PROVIDER_SITE_OTHER): Payer: BC Managed Care – PPO

## 2011-10-29 DIAGNOSIS — Z0000 Encounter for general adult medical examination without abnormal findings: Secondary | ICD-10-CM

## 2011-10-29 LAB — BASIC METABOLIC PANEL
BUN: 17 mg/dL (ref 6–23)
Calcium: 9.3 mg/dL (ref 8.4–10.5)
Creatinine, Ser: 0.8 mg/dL (ref 0.4–1.2)
GFR: 72.86 mL/min (ref >60.00)

## 2011-10-29 LAB — CBC WITH DIFFERENTIAL/PLATELET
Basophils Relative: 1.2 % (ref 0.0–3.0)
Eosinophils Relative: 3.8 % (ref 0.0–5.0)
Lymphocytes Relative: 27.4 % (ref 12.0–46.0)
MCV: 91.7 fl (ref 78.0–100.0)
Monocytes Absolute: 0.4 10*3/uL (ref 0.1–1.0)
Neutrophils Relative %: 58.7 % (ref 43.0–77.0)
Platelets: 160 10*3/uL (ref 150.0–400.0)
RBC: 4.39 Mil/uL (ref 3.87–5.11)
WBC: 4.7 10*3/uL (ref 4.5–10.5)

## 2011-10-29 LAB — LIPID PANEL
Cholesterol: 190 mg/dL (ref 0–200)
HDL: 47.5 mg/dL (ref >39.00)
LDL Cholesterol: 118 mg/dL — ABNORMAL HIGH (ref 0–99)
VLDL: 24.8 mg/dL (ref 0.0–40.0)

## 2011-10-29 LAB — HEPATIC FUNCTION PANEL
ALT: 30 U/L (ref 0–35)
Alkaline Phosphatase: 95 U/L (ref 39–117)
Bilirubin, Direct: 0 mg/dL (ref 0.0–0.3)
Total Bilirubin: 0.6 mg/dL (ref 0.3–1.2)
Total Protein: 7.3 g/dL (ref 6.0–8.3)

## 2011-10-29 LAB — POCT URINALYSIS DIPSTICK
Bilirubin, UA: NEGATIVE
Ketones, UA: NEGATIVE
Leukocytes, UA: NEGATIVE
Spec Grav, UA: 1.02
pH, UA: 7

## 2011-11-05 ENCOUNTER — Ambulatory Visit (INDEPENDENT_AMBULATORY_CARE_PROVIDER_SITE_OTHER): Payer: BC Managed Care – PPO | Admitting: Internal Medicine

## 2011-11-05 ENCOUNTER — Encounter: Payer: Self-pay | Admitting: Internal Medicine

## 2011-11-05 VITALS — BP 140/90 | HR 70 | Temp 97.7°F | Resp 18 | Ht 63.5 in | Wt 195.0 lb

## 2011-11-05 DIAGNOSIS — K219 Gastro-esophageal reflux disease without esophagitis: Secondary | ICD-10-CM

## 2011-11-05 DIAGNOSIS — R51 Headache: Secondary | ICD-10-CM

## 2011-11-05 DIAGNOSIS — Z0000 Encounter for general adult medical examination without abnormal findings: Secondary | ICD-10-CM

## 2011-11-05 DIAGNOSIS — I10 Essential (primary) hypertension: Secondary | ICD-10-CM

## 2011-11-05 MED ORDER — INDOMETHACIN 25 MG PO CAPS: 25.0000 mg | ORAL_CAPSULE | Freq: Two times a day (BID) | ORAL | Status: DC

## 2011-11-05 MED ORDER — HYDROCHLOROTHIAZIDE 25 MG PO TABS: 25.0000 mg | ORAL_TABLET | Freq: Every day | ORAL | Status: DC

## 2011-11-05 MED ORDER — LEVOTHYROXINE SODIUM 50 MCG PO TABS: 50.0000 ug | ORAL_TABLET | Freq: Every day | ORAL | Status: DC

## 2011-11-05 MED ORDER — PANTOPRAZOLE SODIUM 40 MG PO TBEC: 40.0000 mg | DELAYED_RELEASE_TABLET | Freq: Every day | ORAL | Status: DC

## 2011-11-05 NOTE — Progress Notes (Signed)
Subjective:    Patient ID: Karen Cordova, female    DOB: 04/23/1951, 61 y.o.   MRN: [id number]  HPI  61 year old patient who is in today for a annual health assessment. She has treated hypertension controlled on diuretic therapy she has hypothyroidism and also history of restless leg syndrome. [facility] to chronic daily headaches. Her chief complaint today is worsening reflux symptoms. She continues to have some left ankle discomfort but seems to be improving  Preventive Screening-Counseling & Management  Alcohol-Tobacco  Smoking Status: never   Allergies:  1) ! Codeine Phosphate (Codeine Phosphate)   Past History:  Past Medical History:  Reviewed history from 07/11/2007 and no changes required.  Hypertension  Hypothyroidism  Restless legs  Diverticulosis, colon  GERD  Allergic rhinitis  Asthma   Past Surgical History:  Tubal ligation  Thyroidectomy, partail  colonoscopy 2007   Family History:  Reviewed history from 07/11/2007 and no changes required.  father age 57 in good health. Mother died at 66, heart failure, history of COPD, maternal aunt had breast cancer. Maternal uncle lung cancer  Two brothers, one with a history of hypertension   Social History:  Reviewed history from 07/11/2007 and no changes required.  one son one daughter, 5 grandchildrenSmoking Status: never    Review of Systems  Constitutional: Negative for fever, appetite change, fatigue and unexpected weight change.  HENT: Negative for hearing loss, ear pain, nosebleeds, congestion, sore throat, mouth sores, trouble swallowing, neck stiffness, dental problem, voice change, sinus pressure and tinnitus.   Eyes: Negative for photophobia, pain, redness and visual disturbance.  Respiratory: Negative for cough, chest tightness and shortness of breath.   Cardiovascular: Negative for chest pain, palpitations and leg swelling.  Gastrointestinal: Negative for nausea,  vomiting, abdominal pain, diarrhea, constipation, blood in stool, abdominal distention and rectal pain.  Genitourinary: Negative for dysuria, urgency, frequency, hematuria, flank pain, vaginal bleeding, vaginal discharge, difficulty urinating, genital sores, vaginal pain, menstrual problem and pelvic pain.  Musculoskeletal: Negative for back pain and arthralgias (left ankle pain).  Skin: Negative for rash.  Neurological: Positive for headaches. Negative for dizziness, syncope, speech difficulty, weakness, light-headedness and numbness.  Hematological: Negative for adenopathy. Does not bruise/bleed easily.  Psychiatric/Behavioral: Negative for suicidal ideas, behavioral problems, self-injury, dysphoric mood and agitation. The patient is not nervous/anxious.        Objective:   Physical Exam  Constitutional: She is oriented to person, place, and time. She appears well-developed and well-nourished.       Overweight. Weight 195 Blood pressure 130/80  HENT:  Head: Normocephalic and atraumatic.  Right Ear: External ear normal.  Left Ear: External ear normal.  Mouth/Throat: Oropharynx is clear and moist.  Eyes: Conjunctivae and EOM are normal.  Neck: Normal range of motion. Neck supple. No JVD present. No thyromegaly present.  Cardiovascular: Normal rate, regular rhythm, normal heart sounds and intact distal pulses.   No murmur heard. Pulmonary/Chest: Effort normal and breath sounds normal. She has no wheezes. She has no rales.  Abdominal: Soft. Bowel sounds are normal. She exhibits no distension and no mass. There is no tenderness. There is no rebound and no guarding.  Genitourinary: Vagina normal.  Musculoskeletal: Normal range of motion. She exhibits no edema and no tenderness.  Neurological: She is alert and oriented to person, place, and time. She has normal reflexes. No cranial nerve deficit. She exhibits normal muscle tone. Coordination normal.  Skin: Skin is warm and dry. No  rash noted.   Psychiatric: She has a normal mood and affect. Her behavior is normal.          Assessment & Plan:   Preventive health examination Exogenous obesity weight loss exercise encouraged. She and her husband are planning on a joint exercise weight loss program Hypertension stable Symptomatic gastroesophageal reflux disease. Antireflux regimen discussed we'll place on PPI therapy  Low salt diet encouraged Recheck 6 months

## 2011-11-05 NOTE — Patient Instructions (Signed)
Limit your sodium (Salt) intake  Avoids foods high in acid such as tomatoes citrus juices, and spicy foods.  Avoid eating within two hours of lying down or before exercising.  Do not overheat.  Try smaller more frequent meals.  If symptoms persist, elevate the head of her bed 12 inches while sleeping.    It is important that you exercise regularly, at least 20 minutes 3 to 4 times per week.  If you develop chest pain or shortness of breath seek  medical attention.  You need to lose weight.  Consider a lower calorie diet and regular exercise.Diet for Gastroesophageal Reflux Disease, Child Some children have small, brief episodes of reflux. Reflux (acid reflux) is when acid from your stomach flows up into the esophagus. When acid comes in contact with the esophagus, the acid causes irritation and soreness (inflammation) in the esophagus. The reflux may be so small that a child may not notice it. When reflux happens often or so severely that it causes damage to the esophagus, it is called gastroesophageal reflux disease (GERD). Nutrition therapy can help ease the discomfort of GERD.   FOODS TO AVOID    Caffeinated and decaffeinated coffee and black tea.   Regular or low-calorie carbonated beverages or energy drinks (caffeine-free carbonated beverages are allowed).   Strong spices, such as black pepper, white pepper, red pepper, cayenne, curry powder, and chili powder.   Peppermint or spearmint.   Chocolate.   High-fat foods, including meats and fried foods. Extra added fats including oils, butter, salad dressings, and nuts. Low-fat foods may not be recommended for children less than 56 years of age. Discuss this with your doctor or dietitian.   Fruits and vegetables that are not tolerated, such as citrus fruits and tomatoes.   Any food that seems to aggravate the child's condition.  If you have questions regarding your child's diet, call your caregiver or a registered dietician. OTHER THINGS  THAT MAY HELP GERD INCLUDE:  Having the child eat his or her meals slowly, in a relaxed setting.   Serving several small meals throughout the day instead of 3 large meals.   Eliminating food for a period of time if it causes distress.   Not letting the child lie down immediately after eating a meal.   Keeping the head of the child's bed raised 6 to 9 inches (15 to 23 cm) by using a foam wedge or blocks under the legs of the bed.   Encouraging the child to be physically active. Weight loss may be helpful in reducing reflux in overweight or obese children.   Having the child wear loose-fitting clothing.   Avoiding the use of tobacco in parents and caregivers. Secondhand smoke may aggravate symptoms in children with reflux.  SAMPLE MEAL PLAN This is a sample meal plan for a 46 to 7 year old child and is approximately 1200 calories based on https://www.[facility].org/ meal planning guidelines.   Breakfast   cup cooked oatmeal.    cup strawberries.    cup low-fat milk.  Snack   cup cucumber slices.   4 oz yogurt (made from low-fat milk).  Lunch  1 slice whole-wheat bread.   1 oz chicken.    cup blueberries.    cup snap peas.  Snack  3 whole-wheat crackers.   1 oz string cheese.  Dinner   cup brown rice.    cup mixed veggies.   1 cup low-fat milk.   2 oz grilled fish.  Document Released:  11/08/2006 Document Revised: 06/11/2011 Document Reviewed: 05/14/2011 [facility] Patient Information 2012 [place], [place].Gastroesophageal Reflux Disease, Adult Gastroesophageal reflux disease (GERD) happens when acid from your stomach flows up into the esophagus. When acid comes in contact with the esophagus, the acid causes soreness (inflammation) in the esophagus. Over time, GERD may create small holes (ulcers) in the lining of the esophagus. CAUSES    Increased body weight. This puts pressure on the stomach, making acid rise from the stomach into the esophagus.   Smoking. This  increases acid production in the stomach.   Drinking alcohol. This causes decreased pressure in the lower esophageal sphincter (valve or ring of muscle between the esophagus and stomach), allowing acid from the stomach into the esophagus.   Late evening meals and a full stomach. This increases pressure and acid production in the stomach.   A malformed lower esophageal sphincter.  Sometimes, no cause is found. SYMPTOMS    Burning pain in the lower part of the mid-chest behind the breastbone and in the mid-stomach area. This may occur twice a week or more often.   Trouble swallowing.   Sore throat.   Dry cough.   Asthma-like symptoms including chest tightness, shortness of breath, or wheezing.  DIAGNOSIS   Your caregiver may be able to diagnose GERD based on your symptoms. In some cases, X-rays and other tests may be done to check for complications or to check the condition of your stomach and esophagus. TREATMENT   Your caregiver may recommend over-the-counter or prescription medicines to help decrease acid production. Ask your caregiver before starting or adding any new medicines.   HOME CARE INSTRUCTIONS    Change the factors that you can control. Ask your caregiver for guidance concerning weight loss, quitting smoking, and alcohol consumption.   Avoid foods and drinks that make your symptoms worse, such as:   Caffeine or alcoholic drinks.   Chocolate.   Peppermint or mint flavorings.   Garlic and onions.   Spicy foods.   Citrus fruits, such as oranges, lemons, or limes.   Tomato-based foods such as sauce, chili, salsa, and pizza.   Fried and fatty foods.   Avoid lying down for the 3 hours prior to your bedtime or prior to taking a nap.   Eat small, frequent meals instead of large meals.   Wear loose-fitting clothing. Do not wear anything tight around your waist that causes pressure on your stomach.   Raise the head of your bed 6 to 8 inches with wood blocks to  help you sleep. Extra pillows will not help.   Only take over-the-counter or prescription medicines for pain, discomfort, or fever as directed by your caregiver.   Do not take aspirin, ibuprofen, or other nonsteroidal anti-inflammatory drugs (NSAIDs).  SEEK IMMEDIATE MEDICAL CARE IF:    You have pain in your arms, neck, jaw, teeth, or back.   Your pain increases or changes in intensity or duration.   You develop nausea, vomiting, or sweating (diaphoresis).   You develop shortness of breath, or you faint.   Your vomit is green, yellow, black, or looks like coffee grounds or blood.   Your stool is red, bloody, or black.  These symptoms could be signs of other problems, such as heart disease, gastric bleeding, or esophageal bleeding. MAKE SURE YOU:    Understand these instructions.   Will watch your condition.   Will get help right away if you are not doing well or get worse.  Document Released: 04/01/2005 Document  Revised: 06/11/2011 Document Reviewed: 01/09/2011 [facility] Patient Information 2012 [place], [place].

## 2012-07-14 ENCOUNTER — Other Ambulatory Visit: Payer: Self-pay | Admitting: Internal Medicine

## 2012-07-14 DIAGNOSIS — Z1231 Encounter for screening mammogram for malignant neoplasm of breast: Secondary | ICD-10-CM

## 2012-07-22 ENCOUNTER — Ambulatory Visit (HOSPITAL_COMMUNITY)
Admission: RE | Admit: 2012-07-22 | Discharge: 2012-07-22 | Disposition: A | Discharge status: Home / Self Care | Payer: BC Managed Care – PPO | Source: Ambulatory Visit | Attending: Internal Medicine | Admitting: Internal Medicine

## 2012-07-22 DIAGNOSIS — Z1231 Encounter for screening mammogram for malignant neoplasm of breast: Secondary | ICD-10-CM

## 2012-08-06 ENCOUNTER — Other Ambulatory Visit: Payer: Self-pay | Admitting: Internal Medicine

## 2012-12-13 ENCOUNTER — Other Ambulatory Visit (INDEPENDENT_AMBULATORY_CARE_PROVIDER_SITE_OTHER): Payer: BC Managed Care – PPO

## 2012-12-13 DIAGNOSIS — Z0000 Encounter for general adult medical examination without abnormal findings: Secondary | ICD-10-CM

## 2012-12-13 LAB — CBC WITH DIFFERENTIAL/PLATELET
Basophils Absolute: 0.1 10*3/uL (ref 0.0–0.1)
Eosinophils Absolute: 0.2 10*3/uL (ref 0.0–0.7)
Lymphocytes Relative: 23.5 % (ref 12.0–46.0)
MCHC: 33.7 g/dL (ref 30.0–36.0)
MCV: 90.1 fl (ref 78.0–100.0)
Monocytes Absolute: 0.4 10*3/uL (ref 0.1–1.0)
Neutrophils Relative %: 60.4 % (ref 43.0–77.0)
Platelets: 170 10*3/uL (ref 150.0–400.0)

## 2012-12-13 LAB — POCT URINALYSIS DIPSTICK
Ketones, UA: NEGATIVE
Protein, UA: NEGATIVE
Spec Grav, UA: >=1.03

## 2012-12-13 LAB — HEPATIC FUNCTION PANEL
AST: 23 U/L (ref 0–37)
Albumin: 3.8 g/dL (ref 3.5–5.2)
Alkaline Phosphatase: 90 U/L (ref 39–117)

## 2012-12-13 LAB — LIPID PANEL
Cholesterol: 183 mg/dL (ref 0–200)
HDL: 35.5 mg/dL — ABNORMAL LOW (ref >39.00)
Triglycerides: 129 mg/dL (ref 0.0–149.0)
VLDL: 25.8 mg/dL (ref 0.0–40.0)

## 2012-12-13 LAB — BASIC METABOLIC PANEL
BUN: 19 mg/dL (ref 6–23)
GFR: 70.65 mL/min (ref >60.00)
Potassium: 3.6 mEq/L (ref 3.5–5.1)
Sodium: 143 mEq/L (ref 135–145)

## 2012-12-13 LAB — TSH: TSH: 0.89 u[IU]/mL (ref 0.35–5.50)

## 2012-12-20 ENCOUNTER — Encounter: Payer: Self-pay | Admitting: Internal Medicine

## 2012-12-20 ENCOUNTER — Ambulatory Visit (INDEPENDENT_AMBULATORY_CARE_PROVIDER_SITE_OTHER): Payer: BC Managed Care – PPO | Admitting: Internal Medicine

## 2012-12-20 DIAGNOSIS — L259 Unspecified contact dermatitis, unspecified cause: Secondary | ICD-10-CM

## 2012-12-20 DIAGNOSIS — Z0000 Encounter for general adult medical examination without abnormal findings: Secondary | ICD-10-CM

## 2012-12-20 DIAGNOSIS — D239 Other benign neoplasm of skin, unspecified: Secondary | ICD-10-CM

## 2012-12-20 DIAGNOSIS — E039 Hypothyroidism, unspecified: Secondary | ICD-10-CM

## 2012-12-20 DIAGNOSIS — J45909 Unspecified asthma, uncomplicated: Secondary | ICD-10-CM

## 2012-12-20 DIAGNOSIS — R7302 Impaired glucose tolerance (oral): Secondary | ICD-10-CM

## 2012-12-20 DIAGNOSIS — R7309 Other abnormal glucose: Secondary | ICD-10-CM

## 2012-12-20 DIAGNOSIS — I10 Essential (primary) hypertension: Secondary | ICD-10-CM

## 2012-12-20 MED ORDER — METHYLPREDNISOLONE ACETATE 80 MG/ML IJ SUSP
80.0000 mg | Freq: Once | INTRAMUSCULAR | Status: AC
  Administered 2012-12-20: 80 mg via INTRAMUSCULAR

## 2012-12-20 NOTE — Patient Instructions (Addendum)
Limit your sodium (Salt) intake    It is important that you exercise regularly, at least 20 minutes 3 to 4 times per week.  If you develop chest pain or shortness of breath seek  medical attention.  You need to lose weight.  Consider a lower calorie diet and regular exercise.  Return in one year for follow-up 

## 2012-12-20 NOTE — Progress Notes (Signed)
Patient ID: Karen Cordova, female   DOB: 1951/01/16, 62 y.o.   MRN: [id number]  Subjective:    Patient ID: Karen Cordova, female    DOB: [birth year], 62 y.o.   MRN: [id number]  HPI  62 -year-old patient who is in today for a annual health assessment. She has treated hypertension controlled on diuretic therapy she has hypothyroidism and also history of restless leg syndrome. [facility] to chronic daily headaches.  Today she complains of very irritated pedunculated skin lesion just above the left eyebrow  Preventive Screening-Counseling & Management  Alcohol-Tobacco  Smoking Status: never   Allergies:  1) ! Codeine Phosphate (Codeine Phosphate)   Past History:  Past Medical History:   Reviewed history from 07/11/2007 and no changes required.  Hypertension  Hypothyroidism  Restless legs  Diverticulosis, colon  GERD  Allergic rhinitis  Asthma   Past Surgical History:  Tubal ligation  Thyroidectomy, partail  colonoscopy 2006 [facility])   Family History:  Reviewed history from 07/11/2007 and no changes required.  father age 44 in good health. Mother died at 73, heart failure, history of COPD, maternal aunt had breast cancer. Maternal uncle lung cancer  Two brothers, one with a history of hypertension   Social History:  Reviewed history from 07/11/2007 and no changes required.  one son one daughter, 5 grandchildren Smoking Status: never    Review of Systems  Constitutional: Negative for fever, appetite change, fatigue and unexpected weight change.  HENT: Negative for hearing loss, ear pain, nosebleeds, congestion, sore throat, mouth sores, trouble swallowing, neck stiffness, dental problem, voice change, sinus pressure and tinnitus.   Eyes: Negative for photophobia, pain, redness and visual disturbance.  Respiratory: Negative for cough, chest tightness and shortness of breath.   Cardiovascular: Negative for chest pain, palpitations and leg  swelling.  Gastrointestinal: Negative for nausea, vomiting, abdominal pain, diarrhea, constipation, blood in stool, abdominal distention and rectal pain.  Genitourinary: Negative for dysuria, urgency, frequency, hematuria, flank pain, vaginal bleeding, vaginal discharge, difficulty urinating, genital sores, vaginal pain, menstrual problem and pelvic pain.  Musculoskeletal: Negative for back pain and arthralgias (left ankle pain).  Skin: Negative for rash.  Neurological: Positive for headaches. Negative for dizziness, syncope, speech difficulty, weakness, light-headedness and numbness.  Hematological: Negative for adenopathy. Does not bruise/bleed easily.  Psychiatric/Behavioral: Negative for suicidal ideas, behavioral problems, self-injury, dysphoric mood and agitation. The patient is not nervous/anxious.        Objective:   Physical Exam  Constitutional: She is oriented to person, place, and time. She appears well-developed and well-nourished.  Overweight. Weight 195 Blood pressure 130/80  HENT:  Head: Normocephalic and atraumatic.  Right Ear: External ear normal.  Left Ear: External ear normal.  Mouth/Throat: Oropharynx is clear and moist.  Eyes: Conjunctivae and EOM are normal.  Neck: Normal range of motion. Neck supple. No JVD present. No thyromegaly present.  Cardiovascular: Normal rate, regular rhythm, normal heart sounds and intact distal pulses.   No murmur heard. Pulmonary/Chest: Effort normal and breath sounds normal. She has no wheezes. She has no rales.  Abdominal: Soft. Bowel sounds are normal. She exhibits no distension and no mass. There is no tenderness. There is no rebound and no guarding.  Genitourinary: Vagina normal.  Musculoskeletal: Normal range of motion. She exhibits no edema and no tenderness.  Neurological: She is alert and oriented to person, place, and time. She has normal reflexes. No cranial nerve deficit. She exhibits normal muscle tone.  Coordination  normal.  Skin: Skin is warm and dry. No rash noted.  3-4 mm pedunculated lesion with a erythematous base just above the left eyebrow  Content dermatitis involving primarily the arms  Psychiatric: She has a normal mood and affect. Her behavior is normal.          Assessment & Plan:   Preventive health examination Exogenous obesity weight loss exercise encouraged. She and her husband are planning on a joint exercise weight loss program Hypertension stable Symptomatic gastroesophageal reflux disease. Antireflux regimen discussed we'll place on PPI therapy  Low salt diet encouraged Recheck 6 months

## 2012-12-21 ENCOUNTER — Telehealth: Payer: Self-pay | Admitting: Internal Medicine

## 2012-12-21 NOTE — Telephone Encounter (Signed)
Pt called and stated when she was seen in the office yesterday, that her prescriptions where to be called into [facility] on [address]. She is requesting a 3 month supply of the following medications; pantoprazole (PROTONIX) 40 MG tablet, levothyroxine (SYNTHROID, LEVOTHROID) 50 MCG tablet, indomethacin (INDOCIN) 25 MG capsule, and hydrochlorothiazide (HYDRODIURIL) 25 MG tablet. Please assist.

## 2012-12-22 MED ORDER — PANTOPRAZOLE SODIUM 40 MG PO TBEC: DELAYED_RELEASE_TABLET | ORAL | Status: DC

## 2012-12-22 MED ORDER — INDOMETHACIN 25 MG PO CAPS: 25.0000 mg | ORAL_CAPSULE | Freq: Two times a day (BID) | ORAL | Status: DC

## 2012-12-22 MED ORDER — HYDROCHLOROTHIAZIDE 25 MG PO TABS: 25.0000 mg | ORAL_TABLET | Freq: Every day | ORAL | Status: DC

## 2012-12-22 MED ORDER — LEVOTHYROXINE SODIUM 50 MCG PO TABS: 50.0000 ug | ORAL_TABLET | Freq: Every day | ORAL | Status: DC

## 2012-12-22 NOTE — Telephone Encounter (Signed)
Left detailed message Rx's were sent to pharmacy as requested. 

## 2013-03-23 ENCOUNTER — Ambulatory Visit: Payer: BC Managed Care – PPO | Admitting: Internal Medicine

## 2013-05-18 ENCOUNTER — Ambulatory Visit (INDEPENDENT_AMBULATORY_CARE_PROVIDER_SITE_OTHER): Payer: BC Managed Care – PPO | Admitting: Internal Medicine

## 2013-05-18 ENCOUNTER — Encounter: Payer: Self-pay | Admitting: Internal Medicine

## 2013-05-18 VITALS — BP 162/108 | HR 100 | Temp 97.6°F | Ht 63.5 in | Wt 192.0 lb

## 2013-05-18 DIAGNOSIS — I10 Essential (primary) hypertension: Secondary | ICD-10-CM

## 2013-05-18 DIAGNOSIS — E039 Hypothyroidism, unspecified: Secondary | ICD-10-CM

## 2013-05-18 DIAGNOSIS — J45909 Unspecified asthma, uncomplicated: Secondary | ICD-10-CM

## 2013-05-18 MED ORDER — LISINOPRIL-HYDROCHLOROTHIAZIDE 20-25 MG PO TABS: 1.0000 | ORAL_TABLET | Freq: Every day | ORAL | Status: DC

## 2013-05-18 NOTE — Progress Notes (Signed)
Subjective:    Patient ID: Karen Cordova, female    DOB: [birth year], 62 y.o.   MRN: [id number]  HPI Pre-visit discussion using our clinic review tool. No additional management support is needed unless otherwise documented below in the visit note.   62 year old patient who has remote history of asthma as well as treated hypertension. She also has a history of gastroesophageal reflux disease. She was seen in June for her annual exam. For the past month she complains of some dyspnea on exertion when she walks up an incline. Denies any role wheezing or chest pain she describes a sense of being unable to take a full deep breath.  She also describes some tenderness in the epigastric area but no real exertional discomfort. She states her blood pressure has run a bit higher than normal.  Past Medical History  Diagnosis Date  . ALLERGIC RHINITIS 07/11/2007  . ASTHMA 07/11/2007  . DIVERTICULOSIS, COLON 07/06/2007  . GERD 07/06/2007  . HYPERTENSION 07/06/2007  . HYPOTHYROIDISM 07/06/2007  . PARESTHESIA 02/13/2010  . RESTLESS LEG SYNDROME 08/07/2008    History   Social History  . Marital Status: Married    Spouse Name: N/A    Number of Children: N/A  . Years of Education: N/A   Occupational History  . Not on file.   Social History Main Topics  . Smoking status: Never Smoker   . Smokeless tobacco: Never Used  . Alcohol Use: No  . Drug Use: No  . Sexual Activity: Not on file   Other Topics Concern  . Not on file   Social History Narrative  . No narrative on file    Past Surgical History  Procedure Laterality Date  . Tubal ligation    . Thyroidectomy      partial    No family history on file.  Allergies  Allergen Reactions  . Codeine Phosphate     Current Outpatient Prescriptions on File Prior to Visit  Medication Sig Dispense Refill  . hydrochlorothiazide (HYDRODIURIL) 25 MG tablet Take 1 tablet (25 mg total) by mouth daily.  90 tablet  3  . indomethacin (INDOCIN) 25 MG  capsule Take 1 capsule (25 mg total) by mouth 2 (two) times daily with a meal. As needed  180 capsule  3  . levothyroxine (SYNTHROID, LEVOTHROID) 50 MCG tablet Take 1 tablet (50 mcg total) by mouth daily.  90 tablet  3  . pantoprazole (PROTONIX) 40 MG tablet TAKE ONE TABLET BY MOUTH EVERY DAY  90 tablet  3   No current facility-administered medications on file prior to visit.    BP 162/108  Pulse 100  Temp(Src) 97.6 F (36.4 C) (Oral)  Ht 5' 3.5" (1.613 m)  Wt 192 lb (87.091 kg)  BMI 33.47 kg/m2    Review of Systems  Constitutional: Negative.   HENT: Negative for congestion, dental problem, hearing loss, rhinorrhea, sinus pressure, sore throat and tinnitus.   Eyes: Negative for pain, discharge and visual disturbance.  Respiratory: Positive for shortness of breath. Negative for cough, chest tightness and wheezing.   Cardiovascular: Negative for chest pain, palpitations and leg swelling.  Gastrointestinal: Negative for nausea, vomiting, abdominal pain, diarrhea, constipation, blood in stool and abdominal distention.  Genitourinary: Negative for dysuria, urgency, frequency, hematuria, flank pain, vaginal bleeding, vaginal discharge, difficulty urinating, vaginal pain and pelvic pain.  Musculoskeletal: Negative for arthralgias, gait problem and joint swelling.  Skin: Negative for rash.  Neurological: Negative for dizziness, syncope, speech difficulty, weakness, numbness and headaches.  Hematological: Negative for adenopathy.  Psychiatric/Behavioral: Negative for behavioral problems, dysphoric mood and agitation. The patient is not nervous/anxious.        Objective:   Physical Exam  Constitutional: She is oriented to person, place, and time. She appears well-developed and well-nourished.  HENT:  Head: Normocephalic.  Right Ear: External ear normal.  Left Ear: External ear normal.  Mouth/Throat: Oropharynx is clear and moist.  Eyes: Conjunctivae and EOM are normal. Pupils are  equal, round, and reactive to light.  Neck: Normal range of motion. Neck supple. No thyromegaly present.  Cardiovascular: Normal rate, regular rhythm, normal heart sounds and intact distal pulses.   Pulmonary/Chest: Effort normal and breath sounds normal. No respiratory distress. She has no wheezes. She has no rales.  O2 saturation 98 Pulse 70  Abdominal: Soft. Bowel sounds are normal. She exhibits no mass. There is no tenderness.  Very mild epigastric tenderness to palpation  Musculoskeletal: Normal range of motion.  Lymphadenopathy:    She has no cervical adenopathy.  Neurological: She is alert and oriented to person, place, and time.  Skin: Skin is warm and dry. No rash noted.  Psychiatric: She has a normal mood and affect. Her behavior is normal.          Assessment & Plan:   H/o asthma- will give a trial of Pulmicort and recheck in 4 weeks HTN-add lisinopril  Call if any clinical worsening

## 2013-05-18 NOTE — Patient Instructions (Signed)
Avoids foods high in acid such as tomatoes citrus juices, and spicy foods.  Avoid eating within two hours of lying down or before exercising.  Do not overheat.  Try smaller more frequent meals.  If symptoms persist, elevate the head of her bed 12 inches while sleeping.  Please check your blood pressure on a regular basis.  If it is consistently greater than 150/90, please make an office appointment.  Limit your sodium (Salt) intake  Recheck in 4 weeks  Pulmicort 2 puffs twice daily

## 2013-05-18 NOTE — Progress Notes (Signed)
Pre visit review using our clinic review tool, if applicable. No additional management support is needed unless otherwise documented below in the visit note. 

## 2013-06-15 ENCOUNTER — Ambulatory Visit: Payer: BC Managed Care – PPO | Admitting: Internal Medicine

## 2013-06-27 ENCOUNTER — Other Ambulatory Visit: Payer: Self-pay | Admitting: Internal Medicine

## 2013-06-27 ENCOUNTER — Encounter: Payer: Self-pay | Admitting: Internal Medicine

## 2013-06-27 ENCOUNTER — Ambulatory Visit (INDEPENDENT_AMBULATORY_CARE_PROVIDER_SITE_OTHER)
Admission: RE | Admit: 2013-06-27 | Discharge: 2013-06-27 | Disposition: A | Discharge status: Home / Self Care | Payer: BC Managed Care – PPO | Source: Ambulatory Visit | Attending: Internal Medicine | Admitting: Internal Medicine

## 2013-06-27 ENCOUNTER — Ambulatory Visit (INDEPENDENT_AMBULATORY_CARE_PROVIDER_SITE_OTHER): Payer: BC Managed Care – PPO | Admitting: Internal Medicine

## 2013-06-27 ENCOUNTER — Other Ambulatory Visit: Payer: BC Managed Care – PPO

## 2013-06-27 VITALS — BP 140/100 | Temp 98.5°F | Wt 190.0 lb

## 2013-06-27 DIAGNOSIS — R0600 Dyspnea, unspecified: Secondary | ICD-10-CM

## 2013-06-27 DIAGNOSIS — R0609 Other forms of dyspnea: Secondary | ICD-10-CM

## 2013-06-27 DIAGNOSIS — I10 Essential (primary) hypertension: Secondary | ICD-10-CM

## 2013-06-27 DIAGNOSIS — J45909 Unspecified asthma, uncomplicated: Secondary | ICD-10-CM

## 2013-06-27 DIAGNOSIS — R0602 Shortness of breath: Secondary | ICD-10-CM

## 2013-06-27 NOTE — Progress Notes (Signed)
Subjective:    Patient ID: Karen Cordova, female    DOB: [birth year], 62 y.o.   MRN: [id number]  HPI Pre-visit discussion using our clinic review tool. No additional management support is needed unless otherwise documented below in the visit note.  62 year old patient who is seen today for followup. She has treated hypertension. Lisinopril was added to her regimen last visit and home blood pressure monitor and reveals much improved control. She does have a history of asthma and at her last visit complained of increasing dyspnea exertion for the past prior month. She also describes a sense of air hunger and shortness of breath at rest. She states for several months she has been sleeping more upright. Denies any exertional chest pain. Due to her symptoms of dyspnea as well as a history of asthma she is given and follow Pulmicort over the past 4 weeks. There was no clinical change. Patient denies any wheezing  Past Medical History  Diagnosis Date  . ALLERGIC RHINITIS 07/11/2007  . ASTHMA 07/11/2007  . DIVERTICULOSIS, COLON 07/06/2007  . GERD 07/06/2007  . HYPERTENSION 07/06/2007  . HYPOTHYROIDISM 07/06/2007  . PARESTHESIA 02/13/2010  . RESTLESS LEG SYNDROME 08/07/2008    History   Social History  . Marital Status: Married    Spouse Name: N/A    Number of Children: N/A  . Years of Education: N/A   Occupational History  . Not on file.   Social History Main Topics  . Smoking status: Never Smoker   . Smokeless tobacco: Never Used  . Alcohol Use: No  . Drug Use: No  . Sexual Activity: Not on file   Other Topics Concern  . Not on file   Social History Narrative  . No narrative on file    Past Surgical History  Procedure Laterality Date  . Tubal ligation    . Thyroidectomy      partial    No family history on file.  Allergies  Allergen Reactions  . Codeine Phosphate     Current Outpatient Prescriptions on File Prior to Visit  Medication Sig Dispense Refill  .  indomethacin (INDOCIN) 25 MG capsule Take 1 capsule (25 mg total) by mouth 2 (two) times daily with a meal. As needed  180 capsule  3  . levothyroxine (SYNTHROID, LEVOTHROID) 50 MCG tablet Take 1 tablet (50 mcg total) by mouth daily.  90 tablet  3  . lisinopril-hydrochlorothiazide (PRINZIDE,ZESTORETIC) 20-25 MG per tablet Take 1 tablet by mouth daily.  90 tablet  3  . pantoprazole (PROTONIX) 40 MG tablet TAKE ONE TABLET BY MOUTH EVERY DAY  90 tablet  3   No current facility-administered medications on file prior to visit.    BP 140/100  Temp(Src) 98.5 F (36.9 C) (Oral)  Wt 190 lb (86.183 kg)       Review of Systems  Constitutional: Negative.   HENT: Negative for congestion, dental problem, hearing loss, rhinorrhea, sinus pressure, sore throat and tinnitus.   Eyes: Negative for pain, discharge and visual disturbance.  Respiratory: Positive for shortness of breath. Negative for cough.   Cardiovascular: Negative for chest pain, palpitations and leg swelling.  Gastrointestinal: Negative for nausea, vomiting, abdominal pain, diarrhea, constipation, blood in stool and abdominal distention.  Genitourinary: Negative for dysuria, urgency, frequency, hematuria, flank pain, vaginal bleeding, vaginal discharge, difficulty urinating, vaginal pain and pelvic pain.  Musculoskeletal: Negative for arthralgias, gait problem and joint swelling.  Skin: Negative for rash.  Neurological: Negative for dizziness, syncope, speech difficulty,  weakness, numbness and headaches.  Hematological: Negative for adenopathy.  Psychiatric/Behavioral: Negative for behavioral problems, dysphoric mood and agitation. The patient is not nervous/anxious.        Objective:   Physical Exam  Constitutional: She is oriented to person, place, and time. She appears well-developed and well-nourished.  HENT:  Head: Normocephalic.  Right Ear: External ear normal.  Left Ear: External ear normal.  Mouth/Throat: Oropharynx is  clear and moist.  Eyes: Conjunctivae and EOM are normal. Pupils are equal, round, and reactive to light.  Neck: Normal range of motion. Neck supple. No thyromegaly present.  Cardiovascular: Normal rate, regular rhythm, normal heart sounds and intact distal pulses.   Pulmonary/Chest: Effort normal and breath sounds normal.  Resting O2 saturation 95 with a resting pulse rate 74 After a brief brisk walk up and down the hall, pulse rate increased to 120 and O2 saturation dropped to 92-94%- immediately post exercise but quickly returned to baseline within 30 seconds  Abdominal: Soft. Bowel sounds are normal. She exhibits no mass. There is no tenderness.  Musculoskeletal: Normal range of motion.  Lymphadenopathy:    She has no cervical adenopathy.  Neurological: She is alert and oriented to person, place, and time.  Skin: Skin is warm and dry. No rash noted.  Psychiatric: She has a normal mood and affect. Her behavior is normal.          Assessment & Plan:   Dyspnea. We'll review a chest x-ray and 2-D echocardiogram. EKG was obtained earlier this year and did reveal poor R. wave progression and anteroseptal MI cannot be excluded. A d-dimer will also be checked. Depending  on the results of these studies, we'll consider PFTs and possible high resolution CT and/or pulmonary consult

## 2013-06-27 NOTE — Patient Instructions (Addendum)
Limit your sodium (Salt) intake    It is important that you exercise regularly, at least 20 minutes 3 to 4 times per week.  If you develop chest pain or shortness of breath seek  medical attention.  Return in one month for follow-up  

## 2013-06-27 NOTE — Progress Notes (Signed)
Pre visit review using our clinic review tool, if applicable. No additional management support is needed unless otherwise documented below in the visit note. 

## 2013-07-17 ENCOUNTER — Encounter: Payer: Self-pay | Admitting: Cardiology

## 2013-07-17 ENCOUNTER — Ambulatory Visit (HOSPITAL_COMMUNITY): Payer: BC Managed Care – PPO | Attending: Cardiology | Admitting: Radiology

## 2013-07-17 DIAGNOSIS — R0602 Shortness of breath: Secondary | ICD-10-CM | POA: Insufficient documentation

## 2013-07-17 DIAGNOSIS — Z6832 Body mass index (BMI) 32.0-32.9, adult: Secondary | ICD-10-CM | POA: Insufficient documentation

## 2013-07-17 DIAGNOSIS — J45909 Unspecified asthma, uncomplicated: Secondary | ICD-10-CM | POA: Insufficient documentation

## 2013-07-17 DIAGNOSIS — R0600 Dyspnea, unspecified: Secondary | ICD-10-CM

## 2013-07-17 DIAGNOSIS — E669 Obesity, unspecified: Secondary | ICD-10-CM | POA: Insufficient documentation

## 2013-07-17 DIAGNOSIS — I10 Essential (primary) hypertension: Secondary | ICD-10-CM | POA: Insufficient documentation

## 2013-07-17 NOTE — Progress Notes (Signed)
Echocardiogram performed.  

## 2013-07-28 ENCOUNTER — Encounter: Payer: Self-pay | Admitting: Internal Medicine

## 2013-07-28 ENCOUNTER — Ambulatory Visit (INDEPENDENT_AMBULATORY_CARE_PROVIDER_SITE_OTHER): Payer: BC Managed Care – PPO | Admitting: Internal Medicine

## 2013-07-28 VITALS — BP 122/80 | HR 85 | Temp 97.7°F | Resp 20 | Ht 63.5 in | Wt 189.0 lb

## 2013-07-28 DIAGNOSIS — Z23 Encounter for immunization: Secondary | ICD-10-CM

## 2013-07-28 DIAGNOSIS — I10 Essential (primary) hypertension: Secondary | ICD-10-CM

## 2013-07-28 DIAGNOSIS — J45909 Unspecified asthma, uncomplicated: Secondary | ICD-10-CM

## 2013-07-28 NOTE — Patient Instructions (Signed)
Limit your sodium (Salt) intake    It is important that you exercise regularly, at least 20 minutes 3 to 4 times per week.  If you develop chest pain or shortness of breath seek  medical attention.  Call or return to clinic prn if these symptoms worsen or fail to improve as anticipated.   Please check your blood pressure on a regular basis.  If it is consistently greater than 150/90, please make an office appointment.

## 2013-07-28 NOTE — Progress Notes (Signed)
Pre-visit discussion using our clinic review tool. No additional management support is needed unless otherwise documented below in the visit note.  

## 2013-07-28 NOTE — Progress Notes (Signed)
Subjective:    Patient ID: Karen Cordova, female    DOB: 20-Feb-1951, 63 y.o.   MRN: [id number]  HPI  63 year old patient who has a history of asthma and a history of DOE of about one years duration. The patient did not improve with bronchodilators or ICS. EKG revealed poor R-wave progression a chest x-ray revealed no active cardiopulmonary disease. A subsequent 2-D echocardiogram was normal. The patient states she has discontinued caffeinated soft drinks also has adopted a low salt diet. She now states her breathing has normalized  Past Medical History  Diagnosis Date  . ALLERGIC RHINITIS 07/11/2007  . ASTHMA 07/11/2007  . DIVERTICULOSIS, COLON 07/06/2007  . GERD 07/06/2007  . HYPERTENSION 07/06/2007  . HYPOTHYROIDISM 07/06/2007  . PARESTHESIA 02/13/2010  . RESTLESS LEG SYNDROME 08/07/2008    History   Social History  . Marital Status: Married    Spouse Name: N/A    Number of Children: N/A  . Years of Education: N/A   Occupational History  . Not on file.   Social History Main Topics  . Smoking status: Never Smoker   . Smokeless tobacco: Never Used  . Alcohol Use: No  . Drug Use: No  . Sexual Activity: Not on file   Other Topics Concern  . Not on file   Social History Narrative  . No narrative on file    Past Surgical History  Procedure Laterality Date  . Tubal ligation    . Thyroidectomy      partial    No family history on file.  Allergies  Allergen Reactions  . Codeine Phosphate     Current Outpatient Prescriptions on File Prior to Visit  Medication Sig Dispense Refill  . indomethacin (INDOCIN) 25 MG capsule Take 1 capsule (25 mg total) by mouth 2 (two) times daily with a meal. As needed  180 capsule  3  . levothyroxine (SYNTHROID, LEVOTHROID) 50 MCG tablet Take 1 tablet (50 mcg total) by mouth daily.  90 tablet  3  . lisinopril-hydrochlorothiazide (PRINZIDE,ZESTORETIC) 20-25 MG per tablet Take 1 tablet by mouth daily.  90 tablet  3  . pantoprazole  (PROTONIX) 40 MG tablet TAKE ONE TABLET BY MOUTH EVERY DAY  90 tablet  3   No current facility-administered medications on file prior to visit.    BP 122/80  Pulse 85  Temp(Src) 97.7 F (36.5 C) (Oral)  Resp 20  Ht 5' 3.5" (1.613 m)  Wt 189 lb (85.73 kg)  BMI 32.95 kg/m2  SpO2 98%       Review of Systems  Constitutional: Negative.   HENT: Negative for congestion, dental problem, hearing loss, rhinorrhea, sinus pressure, sore throat and tinnitus.   Eyes: Negative for pain, discharge and visual disturbance.  Respiratory: Positive for shortness of breath (Resolved). Negative for cough.   Cardiovascular: Negative for chest pain, palpitations and leg swelling.  Gastrointestinal: Negative for nausea, vomiting, abdominal pain, diarrhea, constipation, blood in stool and abdominal distention.  Genitourinary: Negative for dysuria, urgency, frequency, hematuria, flank pain, vaginal bleeding, vaginal discharge, difficulty urinating, vaginal pain and pelvic pain.  Musculoskeletal: Negative for arthralgias, gait problem and joint swelling.  Skin: Negative for rash.  Neurological: Negative for dizziness, syncope, speech difficulty, weakness, numbness and headaches.  Hematological: Negative for adenopathy.  Psychiatric/Behavioral: Negative for behavioral problems, dysphoric mood and agitation. The patient is not nervous/anxious.        Objective:   Physical Exam  Constitutional: She is oriented to person, place, and time. She  appears well-developed and well-nourished.  HENT:  Head: Normocephalic.  Right Ear: External ear normal.  Left Ear: External ear normal.  Mouth/Throat: Oropharynx is clear and moist.  Eyes: Conjunctivae and EOM are normal. Pupils are equal, round, and reactive to light.  Neck: Normal range of motion. Neck supple. No thyromegaly present.  Cardiovascular: Normal rate, regular rhythm, normal heart sounds and intact distal pulses.   Pulmonary/Chest: Effort normal and  breath sounds normal. No respiratory distress. She has no wheezes. She has no rales.  O2 saturation 98  Abdominal: Soft. Bowel sounds are normal. She exhibits no mass. There is no tenderness.  Musculoskeletal: Normal range of motion.  Lymphadenopathy:    She has no cervical adenopathy.  Neurological: She is alert and oriented to person, place, and time.  Skin: Skin is warm and dry. No rash noted.  Psychiatric: She has a normal mood and affect. Her behavior is normal.          Assessment & Plan:   History of DOE. Now resolved History of asthma Hypertension well controlled  CPX as scheduled in 5 months

## 2013-08-09 ENCOUNTER — Telehealth: Payer: Self-pay | Admitting: Internal Medicine

## 2013-08-09 NOTE — Telephone Encounter (Signed)
Relevant patient education mailed to patient.  

## 2013-08-28 ENCOUNTER — Other Ambulatory Visit: Payer: Self-pay | Admitting: Internal Medicine

## 2013-11-01 ENCOUNTER — Other Ambulatory Visit: Payer: Self-pay | Admitting: Internal Medicine

## 2013-11-01 DIAGNOSIS — Z1231 Encounter for screening mammogram for malignant neoplasm of breast: Secondary | ICD-10-CM

## 2013-11-08 ENCOUNTER — Ambulatory Visit (HOSPITAL_COMMUNITY)
Admission: RE | Admit: 2013-11-08 | Discharge: 2013-11-08 | Disposition: A | Discharge status: Home / Self Care | Payer: BC Managed Care – PPO | Source: Ambulatory Visit | Attending: Internal Medicine | Admitting: Internal Medicine

## 2013-11-08 DIAGNOSIS — Z1231 Encounter for screening mammogram for malignant neoplasm of breast: Secondary | ICD-10-CM

## 2013-12-14 ENCOUNTER — Other Ambulatory Visit (INDEPENDENT_AMBULATORY_CARE_PROVIDER_SITE_OTHER): Payer: BC Managed Care – PPO

## 2013-12-14 DIAGNOSIS — Z0000 Encounter for general adult medical examination without abnormal findings: Secondary | ICD-10-CM

## 2013-12-14 LAB — LIPID PANEL
Cholesterol: 175 mg/dL (ref 0–200)
HDL: 41.6 mg/dL (ref >39.00)
LDL CALC: 113 mg/dL — AB (ref 0–99)
NonHDL: 133.4
TRIGLYCERIDES: 104 mg/dL (ref 0.0–149.0)
Total CHOL/HDL Ratio: 4
VLDL: 20.8 mg/dL (ref 0.0–40.0)

## 2013-12-14 LAB — POCT URINALYSIS DIPSTICK
Bilirubin, UA: NEGATIVE
GLUCOSE UA: NEGATIVE
Ketones, UA: NEGATIVE
NITRITE UA: POSITIVE
PH UA: 5.5
PROTEIN UA: NEGATIVE
SPEC GRAV UA: 1.025
UROBILINOGEN UA: 0.2

## 2013-12-14 LAB — BASIC METABOLIC PANEL
BUN: 18 mg/dL (ref 6–23)
CALCIUM: 9.4 mg/dL (ref 8.4–10.5)
CO2: 30 mEq/L (ref 19–32)
Chloride: 105 mEq/L (ref 96–112)
Creatinine, Ser: 0.7 mg/dL (ref 0.4–1.2)
GFR: 85.09 mL/min (ref >60.00)
GLUCOSE: 106 mg/dL — AB (ref 70–99)
POTASSIUM: 3.5 meq/L (ref 3.5–5.1)
SODIUM: 141 meq/L (ref 135–145)

## 2013-12-14 LAB — TSH: TSH: 0.43 u[IU]/mL (ref 0.35–4.50)

## 2013-12-14 LAB — CBC WITH DIFFERENTIAL/PLATELET
BASOS PCT: 0.6 % (ref 0.0–3.0)
Basophils Absolute: 0 10*3/uL (ref 0.0–0.1)
EOS PCT: 3.9 % (ref 0.0–5.0)
Eosinophils Absolute: 0.2 10*3/uL (ref 0.0–0.7)
HEMATOCRIT: 37.9 % (ref 36.0–46.0)
HEMOGLOBIN: 12.7 g/dL (ref 12.0–15.0)
LYMPHS ABS: 1 10*3/uL (ref 0.7–4.0)
LYMPHS PCT: 22.8 % (ref 12.0–46.0)
MCHC: 33.6 g/dL (ref 30.0–36.0)
MCV: 90 fl (ref 78.0–100.0)
MONOS PCT: 7.6 % (ref 3.0–12.0)
Monocytes Absolute: 0.3 10*3/uL (ref 0.1–1.0)
NEUTROS ABS: 3 10*3/uL (ref 1.4–7.7)
Neutrophils Relative %: 65.1 % (ref 43.0–77.0)
Platelets: 164 10*3/uL (ref 150.0–400.0)
RBC: 4.21 Mil/uL (ref 3.87–5.11)
RDW: 13.1 % (ref 11.5–15.5)
WBC: 4.6 10*3/uL (ref 4.0–10.5)

## 2013-12-14 LAB — HEPATIC FUNCTION PANEL
ALBUMIN: 3.9 g/dL (ref 3.5–5.2)
ALK PHOS: 86 U/L (ref 39–117)
ALT: 30 U/L (ref 0–35)
AST: 27 U/L (ref 0–37)
Bilirubin, Direct: 0.1 mg/dL (ref 0.0–0.3)
TOTAL PROTEIN: 7.1 g/dL (ref 6.0–8.3)
Total Bilirubin: 0.5 mg/dL (ref 0.2–1.2)

## 2013-12-21 ENCOUNTER — Encounter: Payer: Self-pay | Admitting: Internal Medicine

## 2013-12-21 ENCOUNTER — Ambulatory Visit (INDEPENDENT_AMBULATORY_CARE_PROVIDER_SITE_OTHER): Payer: BC Managed Care – PPO | Admitting: Internal Medicine

## 2013-12-21 ENCOUNTER — Other Ambulatory Visit (HOSPITAL_COMMUNITY)
Admission: RE | Admit: 2013-12-21 | Discharge: 2013-12-21 | Disposition: A | Discharge status: Home / Self Care | Payer: BC Managed Care – PPO | Source: Ambulatory Visit | Attending: Internal Medicine | Admitting: Internal Medicine

## 2013-12-21 VITALS — BP 140/80 | HR 74 | Temp 98.2°F | Resp 20 | Ht 63.5 in | Wt 187.0 lb

## 2013-12-21 DIAGNOSIS — J309 Allergic rhinitis, unspecified: Secondary | ICD-10-CM

## 2013-12-21 DIAGNOSIS — R7309 Other abnormal glucose: Secondary | ICD-10-CM

## 2013-12-21 DIAGNOSIS — E039 Hypothyroidism, unspecified: Secondary | ICD-10-CM

## 2013-12-21 DIAGNOSIS — Z01419 Encounter for gynecological examination (general) (routine) without abnormal findings: Secondary | ICD-10-CM | POA: Insufficient documentation

## 2013-12-21 DIAGNOSIS — R7302 Impaired glucose tolerance (oral): Secondary | ICD-10-CM

## 2013-12-21 DIAGNOSIS — I10 Essential (primary) hypertension: Secondary | ICD-10-CM

## 2013-12-21 DIAGNOSIS — K219 Gastro-esophageal reflux disease without esophagitis: Secondary | ICD-10-CM

## 2013-12-21 MED ORDER — POTASSIUM CHLORIDE CRYS ER 20 MEQ PO TBCR: 20.0000 meq | EXTENDED_RELEASE_TABLET | Freq: Every day | ORAL | Status: DC

## 2013-12-21 NOTE — Patient Instructions (Addendum)
Limit your sodium (Salt) intake    It is important that you exercise regularly, at least 20 minutes 3 to 4 times per week.  If you develop chest pain or shortness of breath seek  medical attention.  You need to lose weight.  Consider a lower calorie diet and regular exercise.  Please check your blood pressure on a regular basis.  If it is consistently greater than 150/90, please make an office appointment.  Return in one year for follow-up Muscle Cramps and Spasms Muscle cramps and spasms occur when a muscle or muscles tighten and you have no control over this tightening (involuntary muscle contraction). They are a common problem and can develop in any muscle. The most common place is in the calf muscles of the leg. Both muscle cramps and muscle spasms are involuntary muscle contractions, but they also have differences:   Muscle cramps are sporadic and painful. They may last a few seconds to a quarter of an hour. Muscle cramps are often more forceful and last longer than muscle spasms.  Muscle spasms may or may not be painful. They may also last just a few seconds or much longer. CAUSES  It is uncommon for cramps or spasms to be due to a serious underlying problem. In many cases, the cause of cramps or spasms is unknown. Some common causes are:   Overexertion.   Overuse from repetitive motions (doing the same thing over and over).   Remaining in a certain position for a long period of time.   Improper preparation, form, or technique while performing a sport or activity.   Dehydration.   Injury.   Side effects of some medicines.   Abnormally low levels of the salts and ions in your blood (electrolytes), especially potassium and calcium. This could happen if you are taking water pills (diuretics) or you are pregnant.  Some underlying medical problems can make it more likely to develop cramps or spasms. These include, but are not limited to:   Diabetes.   Parkinson disease.    Hormone disorders, such as thyroid problems.   Alcohol abuse.   Diseases specific to muscles, joints, and bones.   Blood vessel disease where not enough blood is getting to the muscles.  HOME CARE INSTRUCTIONS   Stay well hydrated. Drink enough water and fluids to keep your urine clear or pale yellow.  It may be helpful to massage, stretch, and relax the affected muscle.  For tight or tense muscles, use a warm towel, heating pad, or hot shower water directed to the affected area.  If you are sore or have pain after a cramp or spasm, applying ice to the affected area may relieve discomfort.  Put ice in a plastic bag.  Place a towel between your skin and the bag.  Leave the ice on for 15-20 minutes, 03-04 times a day.  Medicines used to treat a known cause of cramps or spasms may help reduce their frequency or severity. Only take over-the-counter or prescription medicines as directed by your caregiver. SEEK MEDICAL CARE IF:  Your cramps or spasms get more severe, more frequent, or do not improve over time.  MAKE SURE YOU:   Understand these instructions.  Will watch your condition.  Will get help right away if you are not doing well or get worse. Document Released: 12/12/2001 Document Revised: 10/17/2012 Document Reviewed: 06/08/2012 [facility] Patient Information 2015 [place], [place]. This information is not intended to replace advice given to you by your health care  provider. Make sure you discuss any questions you have with your health care provider.

## 2013-12-21 NOTE — Progress Notes (Signed)
Pre-visit discussion using our clinic review tool. No additional management support is needed unless otherwise documented below in the visit note.  

## 2013-12-21 NOTE — Progress Notes (Signed)
Patient ID: Karen Cordova, female   DOB: Feb 24, 1951, 63 y.o.   MRN: [id number]  Subjective:    Patient ID: Karen Cordova, female    DOB: 1951/03/07, 63 y.o.   MRN: [id number]  HPI 63 year-old patient who is in today for a annual health assessment.  She has treated hypertension controlled on diuretic therapy;  she has hypothyroidism and also history of restless leg syndrome. [facility] due to chronic daily headaches.   Her only complaint today is a 6-9 month history of occasional cramping in both her hands and feet.  Antihypertensive regimen includes diuretic therapy.  Preventive Screening-Counseling & Management  Alcohol-Tobacco  Smoking Status: never   Allergies:  1) ! Codeine Phosphate (Codeine Phosphate)   Past History:  Past Medical History:    Hypertension  Hypothyroidism  Restless legs  Diverticulosis, colon  GERD  Allergic rhinitis  Asthma   Past Surgical History:  Tubal ligation  Thyroidectomy, partail  colonoscopy 2006 Karen Cordova)   Family History:   father age 81 in good health. Mother died at 36, heart failure, history of COPD, maternal aunt had breast cancer. Maternal uncle lung cancer  Two brothers, one with a history of hypertension   Social History:   one son one daughter, 5 grandchildren Smoking Status: never    Review of Systems  Constitutional: Negative for fever, appetite change, fatigue and unexpected weight change.  HENT: Negative for congestion, dental problem, ear pain, hearing loss, mouth sores, nosebleeds, sinus pressure, sore throat, tinnitus, trouble swallowing and voice change.   Eyes: Negative for photophobia, pain, redness and visual disturbance.  Respiratory: Negative for cough, chest tightness and shortness of breath.   Cardiovascular: Negative for chest pain, palpitations and leg swelling.  Gastrointestinal: Negative for nausea, vomiting, abdominal pain, diarrhea, constipation, blood in stool, abdominal  distention and rectal pain.  Genitourinary: Negative for dysuria, urgency, frequency, hematuria, flank pain, vaginal bleeding, vaginal discharge, difficulty urinating, genital sores, vaginal pain, menstrual problem and pelvic pain.  Musculoskeletal: Negative for arthralgias (left ankle pain), back pain and neck stiffness.  Skin: Negative for rash.  Neurological: Positive for headaches. Negative for dizziness, syncope, speech difficulty, weakness, light-headedness and numbness.  Hematological: Negative for adenopathy. Does not bruise/bleed easily.  Psychiatric/Behavioral: Negative for suicidal ideas, behavioral problems, self-injury, dysphoric mood and agitation. The patient is not nervous/anxious.        Objective:   Physical Exam  Constitutional: She is oriented to person, place, and time. She appears well-developed and well-nourished.  Overweight. Weight 195 Blood pressure 130/80  HENT:  Head: Normocephalic and atraumatic.  Right Ear: External ear normal.  Left Ear: External ear normal.  Mouth/Throat: Oropharynx is clear and moist.  Eyes: Conjunctivae and EOM are normal.  Neck: Normal range of motion. Neck supple. No JVD present. No thyromegaly present.  Cardiovascular: Normal rate, regular rhythm, normal heart sounds and intact distal pulses.   No murmur heard. Pulmonary/Chest: Effort normal and breath sounds normal. She has no wheezes. She has no rales.  Abdominal: Soft. Bowel sounds are normal. She exhibits no distension and no mass. There is no tenderness. There is no rebound and no guarding.  Genitourinary: Vagina normal and uterus normal. Guaiac negative stool. No vaginal discharge found.  Musculoskeletal: Normal range of motion. She exhibits no edema and no tenderness.  Neurological: She is alert and oriented to person, place, and time. She has normal reflexes. No cranial nerve deficit. She exhibits normal muscle tone. Coordination normal.  Skin: Skin is warm and dry. No rash  noted.      Psychiatric: She has a normal mood and affect. Her behavior is normal.          Assessment & Plan:   Preventive health examination Exogenous obesity weight loss exercise encouraged. She and her husband are planning on a joint exercise weight loss program Hypertension stable Symptomatic gastroesophageal reflux disease. Antireflux regimen discussed we'll place on PPI therapy  Low salt diet encouraged Recheck 6 months

## 2013-12-26 LAB — CYTOLOGY - PAP

## 2014-01-10 ENCOUNTER — Telehealth: Payer: Self-pay | Admitting: Internal Medicine

## 2014-01-10 NOTE — Telephone Encounter (Signed)
[facility] [place], [place] is requesting re-fills on the following: pantoprazole (PROTONIX) 40 MG tablet indomethacin (INDOCIN) 25 MG capsule

## 2014-01-12 ENCOUNTER — Telehealth: Payer: Self-pay | Admitting: Internal Medicine

## 2014-01-12 MED ORDER — PANTOPRAZOLE SODIUM 40 MG PO TBEC: DELAYED_RELEASE_TABLET | ORAL | Status: DC

## 2014-01-12 MED ORDER — INDOMETHACIN 25 MG PO CAPS: 25.0000 mg | ORAL_CAPSULE | Freq: Two times a day (BID) | ORAL | Status: DC

## 2014-01-12 NOTE — Telephone Encounter (Signed)
Rxs sent

## 2014-01-12 NOTE — Telephone Encounter (Signed)
[facility] [place], [place]. Is requesting 90 day re-fill on levothyroxine (SYNTHROID, LEVOTHROID) 50 MCG tablet

## 2014-01-15 MED ORDER — LEVOTHYROXINE SODIUM 50 MCG PO TABS: ORAL_TABLET | ORAL | Status: DC

## 2014-01-15 NOTE — Telephone Encounter (Signed)
Rx sent to pharmacy   

## 2014-07-30 ENCOUNTER — Other Ambulatory Visit: Payer: Self-pay | Admitting: Internal Medicine

## 2014-07-31 ENCOUNTER — Other Ambulatory Visit: Payer: Self-pay | Admitting: *Deleted

## 2014-11-15 ENCOUNTER — Other Ambulatory Visit: Payer: Self-pay | Admitting: Internal Medicine

## 2014-11-15 DIAGNOSIS — Z1231 Encounter for screening mammogram for malignant neoplasm of breast: Secondary | ICD-10-CM

## 2014-11-19 ENCOUNTER — Ambulatory Visit (HOSPITAL_COMMUNITY)
Admission: RE | Admit: 2014-11-19 | Discharge: 2014-11-19 | Disposition: A | Discharge status: Home / Self Care | Payer: Medicare Other | Source: Ambulatory Visit | Attending: Internal Medicine | Admitting: Internal Medicine

## 2014-11-19 DIAGNOSIS — Z1231 Encounter for screening mammogram for malignant neoplasm of breast: Secondary | ICD-10-CM | POA: Diagnosis not present

## 2014-12-17 ENCOUNTER — Other Ambulatory Visit (INDEPENDENT_AMBULATORY_CARE_PROVIDER_SITE_OTHER): Payer: Medicare Other

## 2014-12-17 DIAGNOSIS — Z0000 Encounter for general adult medical examination without abnormal findings: Secondary | ICD-10-CM | POA: Diagnosis not present

## 2014-12-17 DIAGNOSIS — E039 Hypothyroidism, unspecified: Secondary | ICD-10-CM | POA: Diagnosis not present

## 2014-12-17 DIAGNOSIS — I10 Essential (primary) hypertension: Secondary | ICD-10-CM

## 2014-12-17 LAB — TSH: TSH: 0.7 u[IU]/mL (ref 0.35–4.50)

## 2014-12-17 LAB — POCT URINALYSIS DIPSTICK
Bilirubin, UA: NEGATIVE
Glucose, UA: NEGATIVE
KETONES UA: NEGATIVE
NITRITE UA: POSITIVE
PH UA: 5.5
Spec Grav, UA: 1.02
Urobilinogen, UA: 0.2

## 2014-12-17 LAB — CBC WITH DIFFERENTIAL/PLATELET
BASOS ABS: 0 10*3/uL (ref 0.0–0.1)
Basophils Relative: 0.9 % (ref 0.0–3.0)
Eosinophils Absolute: 0.2 10*3/uL (ref 0.0–0.7)
Eosinophils Relative: 4 % (ref 0.0–5.0)
HEMATOCRIT: 40 % (ref 36.0–46.0)
HEMOGLOBIN: 13.5 g/dL (ref 12.0–15.0)
LYMPHS ABS: 1.2 10*3/uL (ref 0.7–4.0)
Lymphocytes Relative: 25.6 % (ref 12.0–46.0)
MCHC: 33.8 g/dL (ref 30.0–36.0)
MCV: 89.6 fl (ref 78.0–100.0)
MONO ABS: 0.4 10*3/uL (ref 0.1–1.0)
Monocytes Relative: 9.3 % (ref 3.0–12.0)
Neutro Abs: 2.8 10*3/uL (ref 1.4–7.7)
Neutrophils Relative %: 60.2 % (ref 43.0–77.0)
PLATELETS: 157 10*3/uL (ref 150.0–400.0)
RBC: 4.47 Mil/uL (ref 3.87–5.11)
RDW: 13.1 % (ref 11.5–15.5)
WBC: 4.6 10*3/uL (ref 4.0–10.5)

## 2014-12-17 LAB — LIPID PANEL
Cholesterol: 193 mg/dL (ref 0–200)
HDL: 41.5 mg/dL (ref >39.00)
LDL CALC: 115 mg/dL — AB (ref 0–99)
NONHDL: 151.5
Total CHOL/HDL Ratio: 5
Triglycerides: 184 mg/dL — ABNORMAL HIGH (ref 0.0–149.0)
VLDL: 36.8 mg/dL (ref 0.0–40.0)

## 2014-12-17 LAB — BASIC METABOLIC PANEL
BUN: 19 mg/dL (ref 6–23)
CO2: 29 mEq/L (ref 19–32)
CREATININE: 0.75 mg/dL (ref 0.40–1.20)
Calcium: 9.2 mg/dL (ref 8.4–10.5)
Chloride: 103 mEq/L (ref 96–112)
GFR: 82.22 mL/min (ref >60.00)
GLUCOSE: 121 mg/dL — AB (ref 70–99)
Potassium: 4 mEq/L (ref 3.5–5.1)
Sodium: 139 mEq/L (ref 135–145)

## 2014-12-17 LAB — HEPATIC FUNCTION PANEL
ALBUMIN: 4.1 g/dL (ref 3.5–5.2)
ALK PHOS: 106 U/L (ref 39–117)
ALT: 20 U/L (ref 0–35)
AST: 22 U/L (ref 0–37)
Bilirubin, Direct: 0.1 mg/dL (ref 0.0–0.3)
TOTAL PROTEIN: 7.1 g/dL (ref 6.0–8.3)
Total Bilirubin: 0.4 mg/dL (ref 0.2–1.2)

## 2014-12-24 ENCOUNTER — Ambulatory Visit (INDEPENDENT_AMBULATORY_CARE_PROVIDER_SITE_OTHER): Payer: Medicare Other | Admitting: Internal Medicine

## 2014-12-24 ENCOUNTER — Encounter: Payer: Self-pay | Admitting: Gastroenterology

## 2014-12-24 ENCOUNTER — Encounter: Payer: Self-pay | Admitting: Internal Medicine

## 2014-12-24 VITALS — BP 120/74 | HR 75 | Temp 98.2°F | Resp 20 | Ht 63.0 in | Wt 190.0 lb

## 2014-12-24 DIAGNOSIS — Z0000 Encounter for general adult medical examination without abnormal findings: Secondary | ICD-10-CM

## 2014-12-24 DIAGNOSIS — J3089 Other allergic rhinitis: Secondary | ICD-10-CM

## 2014-12-24 DIAGNOSIS — R7302 Impaired glucose tolerance (oral): Secondary | ICD-10-CM

## 2014-12-24 DIAGNOSIS — E039 Hypothyroidism, unspecified: Secondary | ICD-10-CM

## 2014-12-24 DIAGNOSIS — I10 Essential (primary) hypertension: Secondary | ICD-10-CM

## 2014-12-24 NOTE — Patient Instructions (Signed)
Limit your sodium (Salt) intake    It is important that you exercise regularly, at least 20 minutes 3 to 4 times per week.  If you develop chest pain or shortness of breath seek  medical attention.  You need to lose weight.  Consider a lower calorie diet and regular exercise.  Return in one year for follow-up   Schedule your colonoscopy to help detect colon cancer.  Health Maintenance Adopting a healthy lifestyle and getting preventive care can go a long way to promote health and wellness. Talk with your health care provider about what schedule of regular examinations is right for you. This is a good chance for you to check in with your provider about disease prevention and staying healthy. In between checkups, there are plenty of things you can do on your own. Experts have done a lot of research about which lifestyle changes and preventive measures are most likely to keep you healthy. Ask your health care provider for more information. WEIGHT AND DIET  Eat a healthy diet  Be sure to include plenty of vegetables, fruits, low-fat dairy products, and lean protein.  Do not eat a lot of foods high in solid fats, added sugars, or salt.  Get regular exercise. This is one of the most important things you can do for your health.  Most adults should exercise for at least 150 minutes each week. The exercise should increase your heart rate and make you sweat (moderate-intensity exercise).  Most adults should also do strengthening exercises at least twice a week. This is in addition to the moderate-intensity exercise.  Maintain a healthy weight  Body mass index (BMI) is a measurement that can be used to identify possible weight problems. It estimates body fat based on height and weight. Your health care provider can help determine your BMI and help you achieve or maintain a healthy weight.  For females 26 years of age and older:   A BMI below 18.5 is considered underweight.  A BMI of 18.5 to  24.9 is normal.  A BMI of 25 to 29.9 is considered overweight.  A BMI of 30 and above is considered obese.  Watch levels of cholesterol and blood lipids  You should start having your blood tested for lipids and cholesterol at 66 years of age, then have this test every 5 years.  You may need to have your cholesterol levels checked more often if:  Your lipid or cholesterol levels are high.  You are older than 66 years of age.  You are at high risk for heart disease.  CANCER SCREENING   Lung Cancer  Lung cancer screening is recommended for adults 1-44 years old who are at high risk for lung cancer because of a history of smoking.  A yearly low-dose CT scan of the lungs is recommended for people who:  Currently smoke.  Have quit within the past 15 years.  Have at least a 30-pack-year history of smoking. A pack year is smoking an average of one pack of cigarettes a day for 1 year.  Yearly screening should continue until it has been 15 years since you quit.  Yearly screening should stop if you develop a health problem that would prevent you from having lung cancer treatment.  Breast Cancer  Practice breast self-awareness. This means understanding how your breasts normally appear and feel.  It also means doing regular breast self-exams. Let your health care provider know about any changes, no matter how small.  If you are  in your 20s or 30s, you should have a clinical breast exam (CBE) by a health care provider every 1-3 years as part of a regular health exam.  If you are 16 or older, have a CBE every year. Also consider having a breast X-ray (mammogram) every year.  If you have a family history of breast cancer, talk to your health care provider about genetic screening.  If you are at high risk for breast cancer, talk to your health care provider about having an MRI and a mammogram every year.  Breast cancer gene (BRCA) assessment is recommended for women who have family  members with BRCA-related cancers. BRCA-related cancers include:  Breast.  Ovarian.  Tubal.  Peritoneal cancers.  Results of the assessment will determine the need for genetic counseling and BRCA1 and BRCA2 testing. Cervical Cancer Routine pelvic examinations to screen for cervical cancer are no longer recommended for nonpregnant women who are considered low risk for cancer of the pelvic organs (ovaries, uterus, and vagina) and who do not have symptoms. A pelvic examination may be necessary if you have symptoms including those associated with pelvic infections. Ask your health care provider if a screening pelvic exam is right for you.   The Pap test is the screening test for cervical cancer for women who are considered at risk.  If you had a hysterectomy for a problem that was not cancer or a condition that could lead to cancer, then you no longer need Pap tests.  If you are older than 65 years, and you have had normal Pap tests for the past 10 years, you no longer need to have Pap tests.  If you have had past treatment for cervical cancer or a condition that could lead to cancer, you need Pap tests and screening for cancer for at least 20 years after your treatment.  If you no longer get a Pap test, assess your risk factors if they change (such as having a new sexual partner). This can affect whether you should start being screened again.  Some women have medical problems that increase their chance of getting cervical cancer. If this is the case for you, your health care provider may recommend more frequent screening and Pap tests.  The human papillomavirus (HPV) test is another test that may be used for cervical cancer screening. The HPV test looks for the virus that can cause cell changes in the cervix. The cells collected during the Pap test can be tested for HPV.  The HPV test can be used to screen women 73 years of age and older. Getting tested for HPV can extend the interval  between normal Pap tests from three to five years.  An HPV test also should be used to screen women of any age who have unclear Pap test results.  After 66 years of age, women should have HPV testing as often as Pap tests.  Colorectal Cancer  This type of cancer can be detected and often prevented.  Routine colorectal cancer screening usually begins at 66 years of age and continues through 66 years of age.  Your health care provider may recommend screening at an earlier age if you have risk factors for colon cancer.  Your health care provider may also recommend using home test kits to check for hidden blood in the stool.  A small camera at the end of a tube can be used to examine your colon directly (sigmoidoscopy or colonoscopy). This is done to check for the earliest forms  of colorectal cancer.  Routine screening usually begins at age 33.  Direct examination of the colon should be repeated every 5-10 years through 66 years of age. However, you may need to be screened more often if early forms of precancerous polyps or small growths are found. Skin Cancer  Check your skin from head to toe regularly.  Tell your health care provider about any new moles or changes in moles, especially if there is a change in a mole's shape or color.  Also tell your health care provider if you have a mole that is larger than the size of a pencil eraser.  Always use sunscreen. Apply sunscreen liberally and repeatedly throughout the day.  Protect yourself by wearing long sleeves, pants, a wide-brimmed hat, and sunglasses whenever you are outside. HEART DISEASE, DIABETES, AND HIGH BLOOD PRESSURE   Have your blood pressure checked at least every 1-2 years. High blood pressure causes heart disease and increases the risk of stroke.  If you are between 89 years and 59 years old, ask your health care provider if you should take aspirin to prevent strokes.  Have regular diabetes screenings. This involves  taking a blood sample to check your fasting blood sugar level.  If you are at a normal weight and have a low risk for diabetes, have this test once every three years after 66 years of age.  If you are overweight and have a high risk for diabetes, consider being tested at a younger age or more often. PREVENTING INFECTION  Hepatitis B  If you have a higher risk for hepatitis B, you should be screened for this virus. You are considered at high risk for hepatitis B if:  You were born in a country where hepatitis B is common. Ask your health care provider which countries are considered high risk.  Your parents were born in a high-risk country, and you have not been immunized against hepatitis B (hepatitis B vaccine).  You have HIV or AIDS.  You use needles to inject street drugs.  You live with someone who has hepatitis B.  You have had sex with someone who has hepatitis B.  You get hemodialysis treatment.  You take certain medicines for conditions, including cancer, organ transplantation, and autoimmune conditions. Hepatitis C  Blood testing is recommended for:  Everyone born from [birth year] through 1967.  Anyone with known risk factors for hepatitis C. Sexually transmitted infections (STIs)  You should be screened for sexually transmitted infections (STIs) including gonorrhea and chlamydia if:  You are sexually active and are younger than 66 years of age.  You are older than 66 years of age and your health care provider tells you that you are at risk for this type of infection.  Your sexual activity has changed since you were last screened and you are at an increased risk for chlamydia or gonorrhea. Ask your health care provider if you are at risk.  If you do not have HIV, but are at risk, it may be recommended that you take a prescription medicine daily to prevent HIV infection. This is called pre-exposure prophylaxis (PrEP). You are considered at risk if:  You are sexually active  and do not regularly use condoms or know the HIV status of your partner(s).  You take drugs by injection.  You are sexually active with a partner who has HIV. Talk with your health care provider about whether you are at high risk of being infected with HIV. If you choose to begin  PrEP, you should first be tested for HIV. You should then be tested every 3 months for as long as you are taking PrEP.  PREGNANCY   If you are premenopausal and you may become pregnant, ask your health care provider about preconception counseling.  If you may become pregnant, take 400 to 800 micrograms (mcg) of folic acid every day.  If you want to prevent pregnancy, talk to your health care provider about birth control (contraception). OSTEOPOROSIS AND MENOPAUSE   Osteoporosis is a disease in which the bones lose minerals and strength with aging. This can result in serious bone fractures. Your risk for osteoporosis can be identified using a bone density scan.  If you are 15 years of age or older, or if you are at risk for osteoporosis and fractures, ask your health care provider if you should be screened.  Ask your health care provider whether you should take a calcium or vitamin D supplement to lower your risk for osteoporosis.  Menopause may have certain physical symptoms and risks.  Hormone replacement therapy may reduce some of these symptoms and risks. Talk to your health care provider about whether hormone replacement therapy is right for you.  HOME CARE INSTRUCTIONS   Schedule regular health, dental, and eye exams.  Stay current with your immunizations.   Do not use any tobacco products including cigarettes, chewing tobacco, or electronic cigarettes.  If you are pregnant, do not drink alcohol.  If you are breastfeeding, limit how much and how often you drink alcohol.  Limit alcohol intake to no more than 1 drink per day for nonpregnant women. One drink equals 12 ounces of beer, 5 ounces of wine,  or 1 ounces of hard liquor.  Do not use street drugs.  Do not share needles.  Ask your health care provider for help if you need support or information about quitting drugs.  Tell your health care provider if you often feel depressed.  Tell your health care provider if you have ever been abused or do not feel safe at home. Document Released: 01/05/2011 Document Revised: 11/06/2013 Document Reviewed: 05/24/2013 [facility] Patient Information 2015 [place], [place]. This information is not intended to replace advice given to you by your health care provider. Make sure you discuss any questions you have with your health care provider.

## 2014-12-24 NOTE — Progress Notes (Signed)
Patient ID: Karen Cordova, female   DOB: January 11, 1951, 64 y.o.   MRN: [id number]  Subjective:    Patient ID: Karen Cordova, female    DOB: 1950-11-07, 64 y.o.   MRN: [id number]  HPI 64  year-old patient who is in today for a annual health assessment.  She has treated hypertension controlled on diuretic therapy;  she has hypothyroidism and also history of restless leg syndrome. [facility] due to chronic daily headaches.    Antihypertensive regimen includes diuretic therapy.  Doing well today without concerns or complaints  Preventive Screening-Counseling & Management  Alcohol-Tobacco  Smoking Status: never   Allergies:  1) ! Codeine Phosphate (Codeine Phosphate)   Past History:  Past Medical History:    Hypertension  Hypothyroidism  Restless legs  Diverticulosis, colon  GERD  Allergic rhinitis  Asthma   Past Surgical History:  Tubal ligation  Thyroidectomy, partail  colonoscopy 2006 [name]   Family History:   father age 36 in good health. Mother died at 40, heart failure, history of COPD, maternal aunt had breast cancer. Maternal uncle lung cancer  Two brothers, one with a history of hypertension   Social History:   one son one daughter, 5 grandchildren Smoking Status: never  Retired March 2016  Past Medical History  Diagnosis Date  . ALLERGIC RHINITIS 07/11/2007  . ASTHMA 07/11/2007  . DIVERTICULOSIS, COLON 07/06/2007  . GERD 07/06/2007  . HYPERTENSION 07/06/2007  . HYPOTHYROIDISM 07/06/2007  . PARESTHESIA 02/13/2010  . RESTLESS LEG SYNDROME 08/07/2008    History   Social History  . Marital Status: Married    Spouse Name: N/A  . Number of Children: N/A  . Years of Education: N/A   Occupational History  . Not on file.   Social History Main Topics  . Smoking status: Never Smoker   . Smokeless tobacco: Never Used  . Alcohol Use: No  . Drug Use: No  . Sexual Activity: Not on file   Other Topics Concern  . Not on file    Social History Narrative    Past Surgical History  Procedure Laterality Date  . Tubal ligation    . Thyroidectomy      partial    No family history on file.  Allergies  Allergen Reactions  . Codeine Phosphate     Current Outpatient Prescriptions on File Prior to Visit  Medication Sig Dispense Refill  . indomethacin (INDOCIN) 25 MG capsule Take 1 capsule (25 mg total) by mouth 2 (two) times daily with a meal. As needed 180 capsule 3  . levothyroxine (SYNTHROID, LEVOTHROID) 50 MCG tablet TAKE ONE TABLET BY MOUTH ONCE DAILY 90 tablet 1  . lisinopril-hydrochlorothiazide (PRINZIDE,ZESTORETIC) 20-25 MG per tablet TAKE ONE TABLET BY MOUTH ONCE DAILY 90 tablet 1  . pantoprazole (PROTONIX) 40 MG tablet TAKE ONE TABLET BY MOUTH EVERY DAY 90 tablet 3   No current facility-administered medications on file prior to visit.    BP 120/74 mmHg  Pulse 75  Temp(Src) 98.2 F (36.8 C) (Oral)  Resp 20  Ht 5\' 3"  (1.6 m)  Wt 190 lb (86.183 kg)  BMI 33.67 kg/m2  SpO2 98%   1. Risk factors, based on past  M,S,F history.  Cardio vascular risk factors include hypertension and history of impaired glucose tolerance  2.  Physical activities: Fairly sedentary.  No exercise limitations  3.  Depression/mood: No history depressed mood  4.  Hearing: Complains of some mild tinnitus but no  hearing loss  5.  ADL's: Independent in all aspects of daily living  6.  Fall risk: Low  7.  Home safety: No problems identified  8.  Height weight, and visual acuity; height and weight stable.  Does have a I examination every other year.  Father with macular degeneration  9.  Counseling: Heart healthy diet regular exercise and weight loss.  All encouraged  10. Lab orders based on risk factors: Laboratory profile reviewed  11. Referral : Not appropriate at this time except for follow-up colonoscopy  12. Care plan: Follow colonoscopy weight loss exercise regimen encouraged  13. Cognitive assessment:  Alert in order with normal affect.  No cognitive dysfunction  14. Screening: Patient provided with a written and personalized 5-10 year screening schedule in the AVS.  patient will have follow-up.  10 year colonoscopy will continue to have mammograms every 1 or 2 years, and eye examinations every other year.  Patient was provided with a written and personalized care plan  15. Provider List Update: Primary care medicine and ophthalmology GI     Review of Systems  Constitutional: Negative for fever, appetite change, fatigue and unexpected weight change.  HENT: Negative for congestion, dental problem, ear pain, hearing loss, mouth sores, nosebleeds, sinus pressure, sore throat, tinnitus, trouble swallowing and voice change.   Eyes: Negative for photophobia, pain, redness and visual disturbance.  Respiratory: Negative for cough, chest tightness and shortness of breath.   Cardiovascular: Negative for chest pain, palpitations and leg swelling.  Gastrointestinal: Negative for nausea, vomiting, abdominal pain, diarrhea, constipation, blood in stool, abdominal distention and rectal pain.  Genitourinary: Negative for dysuria, urgency, frequency, hematuria, flank pain, vaginal bleeding, vaginal discharge, difficulty urinating, genital sores, vaginal pain, menstrual problem and pelvic pain.  Musculoskeletal: Negative for back pain, arthralgias (left ankle pain) and neck stiffness.  Skin: Negative for rash.  Neurological: Positive for headaches. Negative for dizziness, syncope, speech difficulty, weakness, light-headedness and numbness.  Hematological: Negative for adenopathy. Does not bruise/bleed easily.  Psychiatric/Behavioral: Negative for suicidal ideas, behavioral problems, self-injury, dysphoric mood and agitation. The patient is not nervous/anxious.        Objective:   Physical Exam  Constitutional: She is oriented to person, place, and time. She appears well-developed and well-nourished.   Overweight. Weight 195 Blood pressure 130/80  HENT:  Head: Normocephalic and atraumatic.  Right Ear: External ear normal.  Left Ear: External ear normal.  Mouth/Throat: Oropharynx is clear and moist.  Eyes: Conjunctivae and EOM are normal.  Neck: Normal range of motion. Neck supple. No JVD present. No thyromegaly present.  Cardiovascular: Normal rate, regular rhythm, normal heart sounds and intact distal pulses.   No murmur heard. Pulmonary/Chest: Effort normal and breath sounds normal. She has no wheezes. She has no rales.  Abdominal: Soft. Bowel sounds are normal. She exhibits no distension and no mass. There is no tenderness. There is no rebound and no guarding.  Genitourinary: Vagina normal and uterus normal. Guaiac negative stool. No vaginal discharge found.  Musculoskeletal: Normal range of motion. She exhibits no edema or tenderness.  Neurological: She is alert and oriented to person, place, and time. She has normal reflexes. No cranial nerve deficit. She exhibits normal muscle tone. Coordination normal.  Skin: Skin is warm and dry. No rash noted.      Psychiatric: She has a normal mood and affect. Her behavior is normal.          Assessment & Plan:   Preventive health examination Exogenous obesity  weight loss exercise encouraged. She and her husband are planning on a joint exercise weight loss program Hypertension stable  Low salt diet encouraged Recheck one year

## 2014-12-24 NOTE — Progress Notes (Signed)
Pre visit review using our clinic review tool, if applicable. No additional management support is needed unless otherwise documented below in the visit note. 

## 2015-02-15 ENCOUNTER — Telehealth: Payer: Self-pay | Admitting: Family Medicine

## 2015-02-15 MED ORDER — PANTOPRAZOLE SODIUM 40 MG PO TBEC: DELAYED_RELEASE_TABLET | ORAL | Status: DC

## 2015-02-15 MED ORDER — LISINOPRIL-HYDROCHLOROTHIAZIDE 20-25 MG PO TABS: 1.0000 | ORAL_TABLET | Freq: Every day | ORAL | Status: DC

## 2015-02-15 MED ORDER — LEVOTHYROXINE SODIUM 50 MCG PO TABS: 50.0000 ug | ORAL_TABLET | Freq: Every day | ORAL | Status: DC

## 2015-02-15 NOTE — Telephone Encounter (Signed)
Refill request for Lisinopril, Levothyroxine, Pantoprazole and a 90 day supply to [facility].

## 2015-02-15 NOTE — Telephone Encounter (Signed)
Refills sent in

## 2015-02-28 ENCOUNTER — Ambulatory Visit (AMBULATORY_SURGERY_CENTER): Payer: Self-pay

## 2015-02-28 VITALS — Ht 64.0 in | Wt 190.0 lb

## 2015-02-28 DIAGNOSIS — Z1211 Encounter for screening for malignant neoplasm of colon: Secondary | ICD-10-CM

## 2015-02-28 NOTE — Progress Notes (Signed)
Patient denies any allergies to egg or soy products. Patient denies complications with anesthesia/sedation except with general anesthesia PON.  Patient denies oxygen use at home and denies diet medications. Emmi instructions for colonoscopy explained and given to patient.

## 2015-03-13 ENCOUNTER — Encounter: Payer: Self-pay | Admitting: Gastroenterology

## 2015-03-13 ENCOUNTER — Ambulatory Visit (AMBULATORY_SURGERY_CENTER): Payer: Medicare Other | Admitting: Gastroenterology

## 2015-03-13 VITALS — BP 116/64 | HR 55 | Temp 97.2°F | Resp 16 | Ht 64.0 in | Wt 190.0 lb

## 2015-03-13 DIAGNOSIS — Z1211 Encounter for screening for malignant neoplasm of colon: Secondary | ICD-10-CM

## 2015-03-13 DIAGNOSIS — D125 Benign neoplasm of sigmoid colon: Secondary | ICD-10-CM | POA: Diagnosis not present

## 2015-03-13 DIAGNOSIS — K635 Polyp of colon: Secondary | ICD-10-CM | POA: Diagnosis not present

## 2015-03-13 DIAGNOSIS — K5730 Diverticulosis of large intestine without perforation or abscess without bleeding: Secondary | ICD-10-CM

## 2015-03-13 MED ORDER — SODIUM CHLORIDE 0.9 % IV SOLN: 500.0000 mL | INTRAVENOUS | Status: DC

## 2015-03-13 NOTE — Op Note (Signed)
[facility]  [address]. [place], [id number]   COLONOSCOPY PROCEDURE REPORT  PATIENT: Karen Cordova, Karen Cordova  MR#: [id number] BIRTHDATE: 1950/11/03 , 6  yrs. old GENDER: female ENDOSCOPIST: Inda Castle, MD REFERRED JI:RCVEL Burnice Logan, M.D. PROCEDURE DATE:  03/13/2015 PROCEDURE:   Colonoscopy, screening and Colonoscopy with snare polypectomy First Screening Colonoscopy - Avg.  risk and is 50 yrs.  old or older - No.  Prior Negative Screening - Now for repeat screening. 10 or more years since last screening  History of Adenoma - Now for follow-up colonoscopy & has been > or = to 3 yrs.  N/A  Polyps removed today? Yes ASA CLASS:   Class II INDICATIONS:Colorectal Neoplasm Risk Assessment for this procedure is average risk. MEDICATIONS: Monitored anesthesia care and Propofol 300 mg IV  DESCRIPTION OF PROCEDURE:   After the risks benefits and alternatives of the procedure were thoroughly explained, informed consent was obtained.  The digital rectal exam revealed no abnormalities of the rectum.   The [id number]  endoscope was introduced through the anus and advanced to the   . No adverse events experienced.   The quality of the prep was (Suprep was used) good.  The instrument was then slowly withdrawn as the colon was fully examined. Estimated blood loss is zero unless otherwise noted in this procedure report.      COLON FINDINGS: There was mild diverticulosis noted in the descending colon and sigmoid colon.   A sessile polyp measuring 3 mm in size was found in the sigmoid colon.  A polypectomy was performed with a cold snare.  The resection was complete, the polyp tissue was completely retrieved and sent to histology.   Internal hemorrhoids were found.  Retroflexed views revealed no abnormalities. The time to cecum = 6.0 Withdrawal time = 9.4   The scope was withdrawn and the procedure completed. COMPLICATIONS: There were no immediate  complications.  ENDOSCOPIC IMPRESSION: 1.   There was mild diverticulosis noted in the descending colon and sigmoid colon 2.   Sessile polyp was found in the sigmoid colon; polypectomy was performed with a cold snare 3.   Internal hemorrhoids  RECOMMENDATIONS: If the polyp(s) removed today are proven to be adenomatous (pre-cancerous) polyps, you will need a repeat colonoscopy in 5 years.  Otherwise you should continue to follow colorectal cancer screening guidelines for "routine risk" patients with colonoscopy in 10 years.  You will receive a letter within 1-2 weeks with the results of your biopsy as well as final recommendations.  Please call my office if you have not received a letter after 3 weeks.  eSigned:  Inda Castle, MD 03/13/2015 10:03 AM   cc:   PATIENT NAME:  Karen Cordova MR#: [id number]

## 2015-03-13 NOTE — Progress Notes (Signed)
Called to room to assist during endoscopic procedure.  Patient ID and intended procedure confirmed with present staff. Received instructions for my participation in the procedure from the performing physician.  

## 2015-03-13 NOTE — Progress Notes (Signed)
Transferred to recovery room. A/O x3, pleased with MAC.  VSS.  Report to Shelia, RN. 

## 2015-03-13 NOTE — Patient Instructions (Signed)
YOU HAD AN ENDOSCOPIC PROCEDURE TODAY AT THE [facility]:   Refer to the procedure report that was given to you for any specific questions about what was found during the examination.  If the procedure report does not answer your questions, please call your gastroenterologist to clarify.  If you requested that your care partner not be given the details of your procedure findings, then the procedure report has been included in a sealed envelope for you to review at your convenience later.  YOU SHOULD EXPECT: Some feelings of bloating in the abdomen. Passage of more gas than usual.  Walking can help get rid of the air that was put into your GI tract during the procedure and reduce the bloating. If you had a lower endoscopy (such as a colonoscopy or flexible sigmoidoscopy) you may notice spotting of blood in your stool or on the toilet paper. If you underwent a bowel prep for your procedure, you may not have a normal bowel movement for a few days.  Please Note:  You might notice some irritation and congestion in your nose or some drainage.  This is from the oxygen used during your procedure.  There is no need for concern and it should clear up in a day or so.  SYMPTOMS TO REPORT IMMEDIATELY:   Following lower endoscopy (colonoscopy or flexible sigmoidoscopy):  Excessive amounts of blood in the stool  Significant tenderness or worsening of abdominal pains  Swelling of the abdomen that is new, acute  Fever of 100F or higher    For urgent or emergent issues, a gastroenterologist can be reached at any hour by calling [phone].   DIET: Your first meal following the procedure should be a small meal and then it is ok to progress to your normal diet. Heavy or fried foods are harder to digest and may make you feel nauseous or bloated.  Likewise, meals heavy in dairy and vegetables can increase bloating.  Drink plenty of fluids but you should avoid alcoholic beverages for 24  hours.  ACTIVITY:  You should plan to take it easy for the rest of today and you should NOT DRIVE or use heavy machinery until tomorrow (because of the sedation medicines used during the test).    FOLLOW UP: Our staff will call the number listed on your records the next business day following your procedure to check on you and address any questions or concerns that you may have regarding the information given to you following your procedure. If we do not reach you, we will leave a message.  However, if you are feeling well and you are not experiencing any problems, there is no need to return our call.  We will assume that you have returned to your regular daily activities without incident.  If any biopsies were taken you will be contacted by phone or by letter within the next 1-3 weeks.  Please call us at [phone] if you have not heard about the biopsies in 3 weeks.    SIGNATURES/CONFIDENTIALITY: You and/or your care partner have signed paperwork which will be entered into your electronic medical record.  These signatures attest to the fact that that the information above on your After Visit Summary has been reviewed and is understood.  Full responsibility of the confidentiality of this discharge information lies with you and/or your care-partner.   Resume medications. Information given on polyps,diverticulosis,hemorrhoids and high fiber diet. 

## 2015-03-14 ENCOUNTER — Telehealth: Payer: Self-pay

## 2015-03-14 NOTE — Telephone Encounter (Signed)
  Follow up Call-  Call back number 03/13/2015  Post procedure Call Back phone  # [phone]  Permission to leave phone message Yes     Patient questions:  Do you have a fever, pain , or abdominal swelling? No. Pain Score  0 *  Have you tolerated food without any problems? Yes.    Have you been able to return to your normal activities? Yes.    Do you have any questions about your discharge instructions: Diet   No. Medications  No. Follow up visit  No.  Do you have questions or concerns about your Care? No.  Actions: * If pain score is 4 or above: No action needed, pain <4.  No problems per the pt. maw

## 2015-03-19 ENCOUNTER — Encounter: Payer: Self-pay | Admitting: Gastroenterology

## 2015-04-24 ENCOUNTER — Encounter: Payer: Medicare Other | Admitting: Gastroenterology

## 2015-10-02 ENCOUNTER — Encounter: Payer: Self-pay | Admitting: Family Medicine

## 2015-10-02 ENCOUNTER — Ambulatory Visit (INDEPENDENT_AMBULATORY_CARE_PROVIDER_SITE_OTHER): Payer: Medicare Other | Admitting: Family Medicine

## 2015-10-02 VITALS — BP 140/94 | HR 86 | Temp 98.0°F | Wt 194.0 lb

## 2015-10-02 DIAGNOSIS — H8112 Benign paroxysmal vertigo, left ear: Secondary | ICD-10-CM | POA: Diagnosis not present

## 2015-10-02 MED ORDER — MECLIZINE HCL 25 MG PO TABS: 25.0000 mg | ORAL_TABLET | Freq: Three times a day (TID) | ORAL | Status: DC | PRN

## 2015-10-02 NOTE — Progress Notes (Signed)
Garret Reddish, MD  Subjective:  Karen Cordova is a 65 y.o. year old very pleasant female patient who presents for/with See problem oriented charting ROS- No facial or extremity weakness. No slurred words or trouble swallowing. no blurry vision or double vision. No paresthesias. No confusion or word finding difficulties. No hearing loss. Has chronic unchanged tinnitus  Past Medical History-  Patient Active Problem List   Diagnosis Date Noted  . Impaired glucose tolerance 12/20/2012  . PARESTHESIA 02/13/2010  . RESTLESS LEG SYNDROME 08/07/2008  . Allergic rhinitis 07/11/2007  . ASTHMA 07/11/2007  . Hypothyroidism 07/06/2007  . Essential hypertension 07/06/2007  . GERD 07/06/2007  . DIVERTICULOSIS, COLON 07/06/2007    Medications- reviewed and updated Current Outpatient Prescriptions  Medication Sig Dispense Refill  . levothyroxine (SYNTHROID, LEVOTHROID) 50 MCG tablet Take 1 tablet (50 mcg total) by mouth daily. 90 tablet 3  . lisinopril-hydrochlorothiazide (PRINZIDE,ZESTORETIC) 20-25 MG per tablet Take 1 tablet by mouth daily. 90 tablet 3  . Multiple Vitamins-Minerals (VISION-VITE PRESERVE PO) Take 1 tablet by mouth daily.    . pantoprazole (PROTONIX) 40 MG tablet TAKE ONE TABLET BY MOUTH EVERY DAY 90 tablet 3  . indomethacin (INDOCIN) 25 MG capsule Take 1 capsule (25 mg total) by mouth 2 (two) times daily with a meal. As needed (Patient not taking: Reported on 02/28/2015) 180 capsule 3   No current facility-administered medications for this visit.    Objective: BP 140/94 mmHg  Pulse 86  Temp(Src) 98 F (36.7 C)  Wt 194 lb (87.998 kg) Gen: NAD, resting comfortably CV: RRR no murmurs rubs or gallops Lungs: CTAB no crackles, wheeze, rhonchi Abdomen: soft/nontender/nondistended/normal bowel sounds. No rebound or guarding.  Ext: no edema Skin: warm, dry Neuro: CN II-XII intact, sensation and reflexes normal throughout, 5/5 muscle strength in bilateral upper and lower  extremities. Normal finger to nose. Normal rapid alternating movements. No pronator drift. Normal romberg. Normal gait.   Dix hallpike to left produced nystagmus, severe vertigo as well as nausea .  Headthrust test negative  Assessment/Plan:  Vertigo S: room spinning sensation since Monday and tends to get nauseous with it. Woke her up several times last night. Room is spinning, feels like she will fall over. Usually leans to the left. Head movements seem to trigger it. Seemed to wake up in the night when she rolled over/changed position. Tinnitus in left ear chronic for a year and not changed in this episode. No hearing loss. Lasts 5 minutes or less when occurs.  A/P: BPPV as cause. Normal neuro exam- very low concern cva/TIA. dix hallpike to left produced nystagmus and vertigo. Treat with meclizine. Gave handout for home epley maneuvers. If not improved in week, may call in and can refer for formal vestibular rehab. I suspect this is left sided. Gave right side version if left sided epley maneuver not helping Return precautions advised specifically new or worsening symptoms  Meds ordered this encounter  Medications  . meclizine (ANTIVERT) 25 MG tablet    Sig: Take 1 tablet (25 mg total) by mouth 3 (three) times daily as needed for dizziness.    Dispense:  30 tablet    Refill:  0

## 2015-10-02 NOTE — Patient Instructions (Signed)
Do home exercises 3x a day. If no better in a week can refer for formal vestibular rehab.   Use meclizine up to 3x a day. May want to schedule at least the first 3 days.   Benign Positional Vertigo Vertigo is the feeling that you or your surroundings are moving when they are not. Benign positional vertigo is the most common form of vertigo. The cause of this condition is not serious (is benign). This condition is triggered by certain movements and positions (is positional). This condition can be dangerous if it occurs while you are doing something that could endanger you or others, such as driving.  CAUSES In many cases, the cause of this condition is not known. It may be caused by a disturbance in an area of the inner ear that helps your brain to sense movement and balance. This disturbance can be caused by a viral infection (labyrinthitis), head injury, or repetitive motion. RISK FACTORS This condition is more likely to develop in:  Women.  People who are 72 years of age or older. SYMPTOMS Symptoms of this condition usually happen when you move your head or your eyes in different directions. Symptoms may start suddenly, and they usually last for less than a minute. Symptoms may include:  Loss of balance and falling.  Feeling like you are spinning or moving.  Feeling like your surroundings are spinning or moving.  Nausea and vomiting.  Blurred vision.  Dizziness.  Involuntary eye movement (nystagmus). Symptoms can be mild and cause only slight annoyance, or they can be severe and interfere with daily life. Episodes of benign positional vertigo may return (recur) over time, and they may be triggered by certain movements. Symptoms may improve over time. DIAGNOSIS This condition is usually diagnosed by medical history and a physical exam of the head, neck, and ears. You may be referred to a health care provider who specializes in ear, nose, and throat (ENT) problems (otolaryngologist)  or a provider who specializes in disorders of the nervous system (neurologist). You may have additional testing, including:  MRI.  A CT scan.  Eye movement tests. Your health care provider may ask you to change positions quickly while he or she watches you for symptoms of benign positional vertigo, such as nystagmus. Eye movement may be tested with an electronystagmogram (ENG), caloric stimulation, the Dix-Hallpike test, or the roll test.  An electroencephalogram (EEG). This records electrical activity in your brain.  Hearing tests. TREATMENT Usually, your health care provider will treat this by moving your head in specific positions to adjust your inner ear back to normal. Surgery may be needed in severe cases, but this is rare. In some cases, benign positional vertigo may resolve on its own in 2-4 weeks. HOME CARE INSTRUCTIONS Safety  Move slowly.Avoid sudden body or head movements.  Avoid driving.  Avoid operating heavy machinery.  Avoid doing any tasks that would be dangerous to you or others if a vertigo episode would occur.  If you have trouble walking or keeping your balance, try using a cane for stability. If you feel dizzy or unstable, sit down right away.  Return to your normal activities as told by your health care provider. Ask your health care provider what activities are safe for you. General Instructions  Take over-the-counter and prescription medicines only as told by your health care provider.  Avoid certain positions or movements as told by your health care provider.  Drink enough fluid to keep your urine clear or pale yellow.  Keep all follow-up visits as told by your health care provider. This is important. SEEK MEDICAL CARE IF:  You have a fever.  Your condition gets worse or you develop new symptoms.  Your family or friends notice any behavioral changes.  Your nausea or vomiting gets worse.  You have numbness or a "pins and needles"  sensation. SEEK IMMEDIATE MEDICAL CARE IF:  You have difficulty speaking or moving.  You are always dizzy.  You faint.  You develop severe headaches.  You have weakness in your legs or arms.  You have changes in your hearing or vision.  You develop a stiff neck.  You develop sensitivity to light.   This information is not intended to replace advice given to you by your health care provider. Make sure you discuss any questions you have with your health care provider.   Document Released: 03/30/2006 Document Revised: 03/13/2015 Document Reviewed: 10/15/2014 Elsevier Interactive Patient Education [employer].

## 2015-10-21 ENCOUNTER — Other Ambulatory Visit: Payer: Self-pay

## 2015-10-21 DIAGNOSIS — Z1231 Encounter for screening mammogram for malignant neoplasm of breast: Secondary | ICD-10-CM

## 2015-11-20 ENCOUNTER — Ambulatory Visit
Admission: RE | Admit: 2015-11-20 | Discharge: 2015-11-20 | Disposition: A | Discharge status: Home / Self Care | Payer: Medicare Other | Source: Ambulatory Visit

## 2015-11-20 DIAGNOSIS — Z1231 Encounter for screening mammogram for malignant neoplasm of breast: Secondary | ICD-10-CM | POA: Diagnosis not present

## 2015-11-25 ENCOUNTER — Other Ambulatory Visit: Payer: Self-pay | Admitting: Internal Medicine

## 2015-11-25 NOTE — Telephone Encounter (Signed)
Denied.  Filled on 02/15/15 for 1 year.  Request is too early.  She has an upcoming exam on 12/31/15 and may receive refills after that visit.

## 2015-12-17 DIAGNOSIS — H3589 Other specified retinal disorders: Secondary | ICD-10-CM | POA: Diagnosis not present

## 2015-12-17 DIAGNOSIS — H353111 Nonexudative age-related macular degeneration, right eye, early dry stage: Secondary | ICD-10-CM | POA: Diagnosis not present

## 2015-12-17 DIAGNOSIS — H02831 Dermatochalasis of right upper eyelid: Secondary | ICD-10-CM | POA: Diagnosis not present

## 2015-12-17 DIAGNOSIS — H2513 Age-related nuclear cataract, bilateral: Secondary | ICD-10-CM | POA: Diagnosis not present

## 2015-12-17 DIAGNOSIS — H02834 Dermatochalasis of left upper eyelid: Secondary | ICD-10-CM | POA: Diagnosis not present

## 2015-12-24 ENCOUNTER — Other Ambulatory Visit (INDEPENDENT_AMBULATORY_CARE_PROVIDER_SITE_OTHER): Payer: Medicare Other

## 2015-12-24 DIAGNOSIS — Z0000 Encounter for general adult medical examination without abnormal findings: Secondary | ICD-10-CM | POA: Diagnosis not present

## 2015-12-24 LAB — BASIC METABOLIC PANEL
BUN: 18 mg/dL (ref 6–23)
CO2: 29 mEq/L (ref 19–32)
Calcium: 9.5 mg/dL (ref 8.4–10.5)
Chloride: 102 mEq/L (ref 96–112)
Creatinine, Ser: 0.81 mg/dL (ref 0.40–1.20)
GFR: 75 mL/min (ref >60.00)
GLUCOSE: 149 mg/dL — AB (ref 70–99)
POTASSIUM: 3.6 meq/L (ref 3.5–5.1)
Sodium: 139 mEq/L (ref 135–145)

## 2015-12-24 LAB — POC URINALSYSI DIPSTICK (AUTOMATED)
BILIRUBIN UA: NEGATIVE
Glucose, UA: NEGATIVE
Ketones, UA: NEGATIVE
NITRITE UA: POSITIVE
PH UA: 5.5
Protein, UA: NEGATIVE
Spec Grav, UA: 1.015
UROBILINOGEN UA: NEGATIVE

## 2015-12-24 LAB — CBC WITH DIFFERENTIAL/PLATELET
Basophils Absolute: 0 10*3/uL (ref 0.0–0.1)
Basophils Relative: 0.8 % (ref 0.0–3.0)
EOS PCT: 4.4 % (ref 0.0–5.0)
Eosinophils Absolute: 0.2 10*3/uL (ref 0.0–0.7)
HCT: 38.1 % (ref 36.0–46.0)
Hemoglobin: 12.9 g/dL (ref 12.0–15.0)
LYMPHS ABS: 1.2 10*3/uL (ref 0.7–4.0)
Lymphocytes Relative: 26.9 % (ref 12.0–46.0)
MCHC: 33.9 g/dL (ref 30.0–36.0)
MCV: 89 fl (ref 78.0–100.0)
MONO ABS: 0.4 10*3/uL (ref 0.1–1.0)
Monocytes Relative: 9.4 % (ref 3.0–12.0)
Neutro Abs: 2.6 10*3/uL (ref 1.4–7.7)
Neutrophils Relative %: 58.5 % (ref 43.0–77.0)
Platelets: 163 10*3/uL (ref 150.0–400.0)
RBC: 4.28 Mil/uL (ref 3.87–5.11)
RDW: 12.6 % (ref 11.5–15.5)
WBC: 4.5 10*3/uL (ref 4.0–10.5)

## 2015-12-24 LAB — HEPATIC FUNCTION PANEL
ALBUMIN: 4 g/dL (ref 3.5–5.2)
ALK PHOS: 99 U/L (ref 39–117)
ALT: 39 U/L — ABNORMAL HIGH (ref 0–35)
AST: 41 U/L — AB (ref 0–37)
Bilirubin, Direct: 0.1 mg/dL (ref 0.0–0.3)
TOTAL PROTEIN: 7.1 g/dL (ref 6.0–8.3)
Total Bilirubin: 0.5 mg/dL (ref 0.2–1.2)

## 2015-12-24 LAB — TSH: TSH: 0.58 u[IU]/mL (ref 0.35–4.50)

## 2015-12-24 LAB — LIPID PANEL
Cholesterol: 188 mg/dL (ref 0–200)
HDL: 37.3 mg/dL — ABNORMAL LOW (ref >39.00)
LDL Cholesterol: 113 mg/dL — ABNORMAL HIGH (ref 0–99)
NONHDL: 150.42
Total CHOL/HDL Ratio: 5
Triglycerides: 187 mg/dL — ABNORMAL HIGH (ref 0.0–149.0)
VLDL: 37.4 mg/dL (ref 0.0–40.0)

## 2015-12-31 ENCOUNTER — Ambulatory Visit (INDEPENDENT_AMBULATORY_CARE_PROVIDER_SITE_OTHER): Payer: Medicare Other | Admitting: Internal Medicine

## 2015-12-31 ENCOUNTER — Encounter: Payer: Self-pay | Admitting: Internal Medicine

## 2015-12-31 VITALS — BP 136/80 | HR 92 | Temp 98.5°F | Resp 20 | Ht 63.5 in | Wt 195.0 lb

## 2015-12-31 DIAGNOSIS — E039 Hypothyroidism, unspecified: Secondary | ICD-10-CM

## 2015-12-31 DIAGNOSIS — J309 Allergic rhinitis, unspecified: Secondary | ICD-10-CM

## 2015-12-31 DIAGNOSIS — R7302 Impaired glucose tolerance (oral): Secondary | ICD-10-CM

## 2015-12-31 DIAGNOSIS — E119 Type 2 diabetes mellitus without complications: Secondary | ICD-10-CM | POA: Insufficient documentation

## 2015-12-31 DIAGNOSIS — I10 Essential (primary) hypertension: Secondary | ICD-10-CM

## 2015-12-31 DIAGNOSIS — Z0000 Encounter for general adult medical examination without abnormal findings: Secondary | ICD-10-CM

## 2015-12-31 DIAGNOSIS — Z23 Encounter for immunization: Secondary | ICD-10-CM

## 2015-12-31 LAB — POCT GLYCOSYLATED HEMOGLOBIN (HGB A1C): Hemoglobin A1C: 7

## 2015-12-31 MED ORDER — METFORMIN HCL ER 500 MG PO TB24: 500.0000 mg | ORAL_TABLET | Freq: Every day | ORAL | Status: DC

## 2015-12-31 MED ORDER — METFORMIN HCL ER 500 MG PO TB24: 1000.0000 mg | ORAL_TABLET | Freq: Every day | ORAL | Status: DC

## 2015-12-31 NOTE — Progress Notes (Signed)
Patient ID: Karen Cordova, female   DOB: 06/18/1951, 65 y.o.   MRN: [id number]  Subjective:    Patient ID: Karen Cordova, female    DOB: 09/28/1950, 65 y.o.   MRN: [id number]  HPI 65 year-old patient who is in today for a annual health assessment.  She has treated hypertension controlled on diuretic therapy;  she has hypothyroidism and also history of restless leg syndrome. [facility] due to chronic daily headaches.    Antihypertensive regimen includes diuretic therapy.    She was seen 2 months ago for benign positional vertigo which has been stable.  Her only complaint is a skin tag involving the left upper inner thigh that she requests removal.  She does monitor home blood pressure readings with nice results.  Colonoscopy performed September 2016 Mammogram May 2017  Preventive Screening-Counseling & Management  Alcohol-Tobacco  Smoking Status: never   Allergies:  1) ! Codeine Phosphate (Codeine Phosphate)   Past History:  Past Medical History:    Hypertension  Hypothyroidism  Restless legs  Diverticulosis, colon  GERD  Allergic rhinitis  Asthma   Past Surgical History:  Tubal ligation  Thyroidectomy, partail  colonoscopy 2006 Karen Cordova)   Family History:   father age 25 in good health. Mother died at 56, heart failure, history of COPD, maternal aunt had breast cancer. Maternal uncle lung cancer  Two brothers, one with a history of hypertension   Social History:   one son one daughter, 5 grandchildren Smoking Status: never  Retired March 2016  Past Medical History  Diagnosis Date  . ALLERGIC RHINITIS 07/11/2007  . DIVERTICULOSIS, COLON 07/06/2007  . GERD 07/06/2007  . HYPERTENSION 07/06/2007  . HYPOTHYROIDISM 07/06/2007  . PARESTHESIA 02/13/2010  . RESTLESS LEG SYNDROME 08/07/2008  . Hypothyroidism   . Vitamin D deficiency   . ASTHMA     as a child, no problem as adult, no inhaler    Social History   Social History  .  Marital Status: Married    Spouse Name: N/A  . Number of Children: N/A  . Years of Education: N/A   Occupational History  . Not on file.   Social History Main Topics  . Smoking status: Never Smoker   . Smokeless tobacco: Never Used  . Alcohol Use: No  . Drug Use: No  . Sexual Activity: Yes    Birth Control/ Protection: Post-menopausal   Other Topics Concern  . Not on file   Social History Narrative    Past Surgical History  Procedure Laterality Date  . Tubal ligation  1976  . Thyroidectomy  1999    partial  . Tonsillectomy and adenoidectomy  1999  . Wisdom tooth extraction    . Colonoscopy      Family History  Problem Relation Age of Onset  . Colon cancer Neg Hx   . Colon polyps Neg Hx     Allergies  Allergen Reactions  . Shrimp [Shellfish Allergy] Hives  . Codeine Phosphate Nausea Only    Current Outpatient Prescriptions on File Prior to Visit  Medication Sig Dispense Refill  . indomethacin (INDOCIN) 25 MG capsule Take 1 capsule (25 mg total) by mouth 2 (two) times daily with a meal. As needed 180 capsule 3  . levothyroxine (SYNTHROID, LEVOTHROID) 50 MCG tablet Take 1 tablet (50 mcg total) by mouth daily. 90 tablet 3  . lisinopril-hydrochlorothiazide (PRINZIDE,ZESTORETIC) 20-25 MG per tablet Take 1 tablet by mouth daily. 90 tablet 3  .  pantoprazole (PROTONIX) 40 MG tablet TAKE ONE TABLET BY MOUTH EVERY DAY 90 tablet 3   No current facility-administered medications on file prior to visit.    BP 136/80 mmHg  Pulse 92  Temp(Src) 98.5 F (36.9 C) (Oral)  Resp 20  Ht 5' 3.5" (1.613 m)  Wt 195 lb (88.451 kg)  BMI 34.00 kg/m2  SpO2 96%   1. Risk factors, based on past  M,S,F history.  Cardio vascular risk factors include hypertension and history of impaired glucose tolerance  2.  Physical activities: Fairly sedentary.  No exercise limitations  3.  Depression/mood: No history depressed mood  4.  Hearing: Complains of some mild tinnitus but no hearing  loss  5.  ADL's: Independent in all aspects of daily living  6.  Fall risk: Low  7.  Home safety: No problems identified  8.  Height weight, and visual acuity; height and weight stable.  Does have a I examination every other year.  Father with macular degeneration  9.  Counseling: Heart healthy diet regular exercise and weight loss.  All encouraged  10. Lab orders based on risk factors: Laboratory profile reviewed  11. Referral : Not appropriate at this time except for follow-up colonoscopy  12. Care plan: Follow colonoscopy weight loss exercise regimen encouraged  13. Cognitive assessment: Alert in order with normal affect.  No cognitive dysfunction  14. Screening: Patient provided with a written and personalized 5-10 year screening schedule in the AVS.  patient will have follow-up.  10 year colonoscopy will continue to have mammograms every 1 or 2 years, and eye examinations every other year.  Patient was provided with a written and personalized care plan  15. Provider List Update: Primary care medicine and ophthalmology GI     Review of Systems  Constitutional: Negative for fever, appetite change, fatigue and unexpected weight change.  HENT: Negative for congestion, dental problem, ear pain, hearing loss, mouth sores, nosebleeds, sinus pressure, sore throat, tinnitus, trouble swallowing and voice change.   Eyes: Negative for photophobia, pain, redness and visual disturbance.  Respiratory: Negative for cough, chest tightness and shortness of breath.   Cardiovascular: Negative for chest pain, palpitations and leg swelling.  Gastrointestinal: Negative for nausea, vomiting, abdominal pain, diarrhea, constipation, blood in stool, abdominal distention and rectal pain.  Genitourinary: Negative for dysuria, urgency, frequency, hematuria, flank pain, vaginal bleeding, vaginal discharge, difficulty urinating, genital sores, vaginal pain, menstrual problem and pelvic pain.   Musculoskeletal: Negative for back pain, arthralgias (left ankle pain) and neck stiffness.  Skin: Negative for rash.  Neurological: Positive for headaches. Negative for dizziness, syncope, speech difficulty, weakness, light-headedness and numbness.  Hematological: Negative for adenopathy. Does not bruise/bleed easily.  Psychiatric/Behavioral: Negative for suicidal ideas, behavioral problems, self-injury, dysphoric mood and agitation. The patient is not nervous/anxious.        Objective:   Physical Exam  Constitutional: She is oriented to person, place, and time. She appears well-developed and well-nourished.  Overweight. Weight 195 Blood pressure 130/80  HENT:  Head: Normocephalic and atraumatic.  Right Ear: External ear normal.  Left Ear: External ear normal.  Mouth/Throat: Oropharynx is clear and moist.  Eyes: Conjunctivae and EOM are normal.  Neck: Normal range of motion. Neck supple. No JVD present. No thyromegaly present.  Cardiovascular: Normal rate, regular rhythm, normal heart sounds and intact distal pulses.   No murmur heard. Pulmonary/Chest: Effort normal and breath sounds normal. She has no wheezes. She has no rales.  Abdominal: Soft. Bowel sounds are  normal. She exhibits no distension and no mass. There is no tenderness. There is no rebound and no guarding.  Musculoskeletal: Normal range of motion. She exhibits no edema or tenderness.  Neurological: She is alert and oriented to person, place, and time. She has normal reflexes. No cranial nerve deficit. She exhibits normal muscle tone. Coordination normal.  Skin: Skin is warm and dry. No rash noted.  Small skin tag, left upper inner thigh.  Excised without difficulty after topical anesthesia    Psychiatric: She has a normal mood and affect. Her behavior is normal.          Assessment & Plan:   Preventive health examination Exogenous obesity weight loss exercise encouraged. She and her husband are planning on a  joint exercise weight loss program Hypertension stable  Skin tag noted left upper inner thigh.  Resected after topical anesthesia Impaired glucose tolerance.  This has worsened over the past 2 years.  Will check hemoglobin A1c   Low salt diet encouraged Recheck one year    Hemoglobin A1c 7.0.  We'll start metformin therapy 1000 mg extended release every morning.  Recheck 3 months  Nyoka Cowden, MD

## 2015-12-31 NOTE — Progress Notes (Signed)
Pre visit review using our clinic review tool, if applicable. No additional management support is needed unless otherwise documented below in the visit note. 

## 2015-12-31 NOTE — Patient Instructions (Addendum)
Limit your sodium (Salt) intake    It is important that you exercise regularly, at least 20 minutes 3 to 4 times per week.  If you develop chest pain or shortness of breath seek  medical attention.  You need to lose weight.  Consider a lower calorie diet and regular exercise.  Please check your blood pressure on a regular basis.  If it is consistently greater than 150/90, please make an office appointment.   Please check your hemoglobin A1c every 3 months Diabetes and Exercise Exercising regularly is important. It is not just about losing weight. It has many health benefits, such as:  Improving your overall fitness, flexibility, and endurance.  Increasing your bone density.  Helping with weight control.  Decreasing your body fat.  Increasing your muscle strength.  Reducing stress and tension.  Improving your overall health. People with diabetes who exercise gain additional benefits because exercise:  Reduces appetite.  Improves the body's use of blood sugar (glucose).  Helps lower or control blood glucose.  Decreases blood pressure.  Helps control blood lipids (such as cholesterol and triglycerides).  Improves the body's use of the hormone insulin by:  Increasing the body's insulin sensitivity.  Reducing the body's insulin needs.  Decreases the risk for heart disease because exercising:  Lowers cholesterol and triglycerides levels.  Increases the levels of good cholesterol (such as high-density lipoproteins [HDL]) in the body.  Lowers blood glucose levels. YOUR ACTIVITY PLAN  Choose an activity that you enjoy, and set realistic goals. To exercise safely, you should begin practicing any new physical activity slowly, and gradually increase the intensity of the exercise over time. Your health care provider or diabetes educator can help create an activity plan that works for you. General recommendations include:  Encouraging children to engage in at least 60 minutes  of physical activity each day.  Stretching and performing strength training exercises, such as yoga or weight lifting, at least 2 times per week.  Performing a total of at least 150 minutes of moderate-intensity exercise each week, such as brisk walking or water aerobics.  Exercising at least 3 days per week, making sure you allow no more than 2 consecutive days to pass without exercising.  Avoiding long periods of inactivity (90 minutes or more). When you have to spend an extended period of time sitting down, take frequent breaks to walk or stretch. RECOMMENDATIONS FOR EXERCISING WITH TYPE 1 OR TYPE 2 DIABETES   Check your blood glucose before exercising. If blood glucose levels are greater than 240 mg/dL, check for urine ketones. Do not exercise if ketones are present.  Avoid injecting insulin into areas of the body that are going to be exercised. For example, avoid injecting insulin into:  The arms when playing tennis.  The legs when jogging.  Keep a record of:  Food intake before and after you exercise.  Expected peak times of insulin action.  Blood glucose levels before and after you exercise.  The type and amount of exercise you have done.  Review your records with your health care provider. Your health care provider will help you to develop guidelines for adjusting food intake and insulin amounts before and after exercising.  If you take insulin or oral hypoglycemic agents, watch for signs and symptoms of hypoglycemia. They include:  Dizziness.  Shaking.  Sweating.  Chills.  Confusion.  Drink plenty of water while you exercise to prevent dehydration or heat stroke. Body water is lost during exercise and must be replaced.  Talk to your health care provider before starting an exercise program to make sure it is safe for you. Remember, almost any type of activity is better than none.   This information is not intended to replace advice given to you by your health  care provider. Make sure you discuss any questions you have with your health care provider.   Document Released: 09/12/2003 Document Revised: 11/06/2014 Document Reviewed: 11/29/2012 Elsevier Interactive Patient Education 2016 [place]. Diabetes and Standards of Medical Care Diabetes is complicated. You may find that your diabetes team includes a dietitian, nurse, diabetes educator, eye doctor, and more. To help everyone know what is going on and to help you get the care you deserve, the following schedule of care was developed to help keep you on track. Below are the tests, exams, vaccines, medicines, education, and plans you will need. HbA1c test This test shows how well you have controlled your glucose over the past 2-3 months. It is used to see if your diabetes management plan needs to be adjusted.   It is performed at least 2 times a year if you are meeting treatment goals.  It is performed 4 times a year if therapy has changed or if you are not meeting treatment goals. Blood pressure test  This test is performed at every routine medical visit. The goal is less than 140/90 mm Hg for most people, but 130/80 mm Hg in some cases. Ask your health care provider about your goal. Dental exam  Follow up with the dentist regularly. Eye exam  If you are diagnosed with type 1 diabetes as a child, get an exam upon reaching the age of 6 years or older and having had diabetes for 3-5 years. Yearly eye exams are recommended after that initial eye exam.  If you are diagnosed with type 1 diabetes as an adult, get an exam within 5 years of diagnosis and then yearly.  If you are diagnosed with type 2 diabetes, get an exam as soon as possible after the diagnosis and then yearly. Foot care exam  Visual foot exams are performed at every routine medical visit. The exams check for cuts, injuries, or other problems with the feet.  You should have a complete foot exam performed every year. This exam  includes an inspection of the structure and skin of your feet, a check of the pulses in your feet, and a check of the sensation in your feet.  Type 1 diabetes: The first exam is performed 5 years after diagnosis.  Type 2 diabetes: The first exam is performed at the time of diagnosis.  Check your feet nightly for cuts, injuries, or other problems with your feet. Tell your health care provider if anything is not healing. Kidney function test (urine microalbumin)  This test is performed once a year.  Type 1 diabetes: The first test is performed 5 years after diagnosis.  Type 2 diabetes: The first test is performed at the time of diagnosis.  A serum creatinine and estimated glomerular filtration rate (eGFR) test is done once a year to assess the level of chronic kidney disease (CKD), if present. Lipid profile (cholesterol, HDL, LDL, triglycerides)  Performed every 5 years for most people.  The goal for LDL is less than 100 mg/dL. If you are at high risk, the goal is less than 70 mg/dL.  The goal for HDL is 40 mg/dL-50 mg/dL for men and 50 mg/dL-60 mg/dL for women. An HDL cholesterol of 60 mg/dL or higher  gives some protection against heart disease.  The goal for triglycerides is less than 150 mg/dL. Immunizations  The flu (influenza) vaccine is recommended yearly for every person 43 months of age or older who has diabetes.  The pneumonia (pneumococcal) vaccine is recommended for every person 9 years of age or older who has diabetes. Adults 78 years of age or older may receive the pneumonia vaccine as a series of two separate shots.  The hepatitis B vaccine is recommended for adults shortly after they have been diagnosed with diabetes.  The Tdap (tetanus, diphtheria, and pertussis) vaccine should be given:  According to normal childhood vaccination schedules, for children.  Every 10 years, for adults who have diabetes. Diabetes self-management education  Education is recommended at  diagnosis and ongoing as needed. Treatment plan  Your treatment plan is reviewed at every medical visit.   This information is not intended to replace advice given to you by your health care provider. Make sure you discuss any questions you have with your health care provider.   Document Released: 04/19/2009 Document Revised: 07/13/2014 Document Reviewed: 11/22/2012 Elsevier Interactive Patient Education 2016 [employer]. Diabetes Mellitus and Food It is important for you to manage your blood sugar (glucose) level. Your blood glucose level can be greatly affected by what you eat. Eating healthier foods in the appropriate amounts throughout the day at about the same time each day will help you control your blood glucose level. It can also help slow or prevent worsening of your diabetes mellitus. Healthy eating may even help you improve the level of your blood pressure and reach or maintain a healthy weight.  General recommendations for healthful eating and cooking habits include:  Eating meals and snacks regularly. Avoid going long periods of time without eating to lose weight.  Eating a diet that consists mainly of plant-based foods, such as fruits, vegetables, nuts, legumes, and whole grains.  Using low-heat cooking methods, such as baking, instead of high-heat cooking methods, such as deep frying. Work with your dietitian to make sure you understand how to use the Nutrition Facts information on food labels. HOW CAN FOOD AFFECT ME? Carbohydrates Carbohydrates affect your blood glucose level more than any other type of food. Your dietitian will help you determine how many carbohydrates to eat at each meal and teach you how to count carbohydrates. Counting carbohydrates is important to keep your blood glucose at a healthy level, especially if you are using insulin or taking certain medicines for diabetes mellitus. Alcohol Alcohol can cause sudden decreases in blood glucose (hypoglycemia),  especially if you use insulin or take certain medicines for diabetes mellitus. Hypoglycemia can be a life-threatening condition. Symptoms of hypoglycemia (sleepiness, dizziness, and disorientation) are similar to symptoms of having too much alcohol.  If your health care provider has given you approval to drink alcohol, do so in moderation and use the following guidelines:  Women should not have more than one drink per day, and men should not have more than two drinks per day. One drink is equal to:  12 oz of beer.  5 oz of wine.  1 oz of hard liquor.  Do not drink on an empty stomach.  Keep yourself hydrated. Have water, diet soda, or unsweetened iced tea.  Regular soda, juice, and other mixers might contain a lot of carbohydrates and should be counted. WHAT FOODS ARE NOT RECOMMENDED? As you make food choices, it is important to remember that all foods are not the same. Some foods  have fewer nutrients per serving than other foods, even though they might have the same number of calories or carbohydrates. It is difficult to get your body what it needs when you eat foods with fewer nutrients. Examples of foods that you should avoid that are high in calories and carbohydrates but low in nutrients include:  Trans fats (most processed foods list trans fats on the Nutrition Facts label).  Regular soda.  Juice.  Candy.  Sweets, such as cake, pie, doughnuts, and cookies.  Fried foods. WHAT FOODS CAN I EAT? Eat nutrient-rich foods, which will nourish your body and keep you healthy. The food you should eat also will depend on several factors, including:  The calories you need.  The medicines you take.  Your weight.  Your blood glucose level.  Your blood pressure level.  Your cholesterol level. You should eat a variety of foods, including:  Protein.  Lean cuts of meat.  Proteins low in saturated fats, such as fish, egg whites, and beans. Avoid processed meats.  Fruits and  vegetables.  Fruits and vegetables that may help control blood glucose levels, such as apples, mangoes, and yams.  Dairy products.  Choose fat-free or low-fat dairy products, such as milk, yogurt, and cheese.  Grains, bread, pasta, and rice.  Choose whole grain products, such as multigrain bread, whole oats, and brown rice. These foods may help control blood pressure.  Fats.  Foods containing healthful fats, such as nuts, avocado, olive oil, canola oil, and fish. DOES EVERYONE WITH DIABETES MELLITUS HAVE THE SAME MEAL PLAN? Because every person with diabetes mellitus is different, there is not one meal plan that works for everyone. It is very important that you meet with a dietitian who will help you create a meal plan that is just right for you.   This information is not intended to replace advice given to you by your health care provider. Make sure you discuss any questions you have with your health care provider.   Document Released: 03/19/2005 Document Revised: 07/13/2014 Document Reviewed: 05/19/2013 Elsevier Interactive Patient Education 2016 [place] for Eating Away From Home If You Have Diabetes Controlling your level of blood glucose, also known as blood sugar, can be challenging. It can be even more difficult when you do not prepare your own meals. The following tips can help you manage your diabetes when you eat away from home. PLANNING AHEAD Plan ahead if you know you will be eating away from home:  Ask your health care provider how to time meals and medicine if you are taking insulin.  Make a list of restaurants near you that offer healthy choices. If they have a carry-out menu, take it home and plan what you will order ahead of time.  Look up the restaurant you want to eat at online. Many chain and fast-food restaurants list nutritional information online. Use this information to choose the healthiest options and to calculate how many carbohydrates will be in  your meal.  Use a carbohydrate-counting book or mobile app to look up the carbohydrate content and serving size of the foods you want to eat.  Become familiar with serving sizes and learn to recognize how many servings are in a portion. This will allow you to estimate how many carbohydrates you can eat. FREE FOODS A "free food" is any food or drink that has less than 5 g of carbohydrates per serving. Free foods include:  Many vegetables.  Hard boiled eggs.  Nuts or seeds.  Olives.  Cheeses.  Meats. These types of foods make good appetizer choices and are often available at salad bars. Lemon juice, vinegar, or a low-calorie salad dressing of fewer than 20 calories per serving can be used as a "free" salad dressing.  CHOICES TO REDUCE CARBOHYDRATES  Substitute nonfat sweetened yogurt with a sugar-free yogurt. Yogurt made from soy milk may also be used, but you will still want a sugar-free or plain option to choose a lower carbohydrate amount.  Ask your server to take away the bread basket or chips from your table.  Order fresh fruit. A salad bar often offers fresh fruit choices. Avoid canned fruit because it is usually packed in sugar or syrup.  Order a salad, and eat it without dressing. Or, create a "free" salad dressing.  Ask for substitutions. For example, instead of [place] fries, request an order of a vegetable such as salad, green beans, or broccoli. OTHER TIPS   If you take insulin, take the insulin once your food arrives to your table. This will ensure your insulin and food are timed correctly.  Ask your server about the portion size before your order, and ask for a take-out box if the portion has more servings than you should have. When your food comes, leave the amount you should have on the plate, and put the rest in the take-out box.  Consider splitting an entree with someone and ordering a side salad.   This information is not intended to replace advice given to you  by your health care provider. Make sure you discuss any questions you have with your health care provider.   Document Released: 06/22/2005 Document Revised: 03/13/2015 Document Reviewed: 09/19/2013 Elsevier Interactive Patient Education 2016 Else[employer] Inc[employer] Type 2 Diabetes Mellitus, Adult Type 2 diabetes mellitus, often simply referred to as type 2 diabetes, is a long-lasting (chronic) disease. In type 2 diabetes, the pancreas does not make enough insulin (a hormone), the cells are less responsive to the insulin that is made (insulin resistance), or both. Normally, insulin moves sugars from food into the tissue cells. The tissue cells use the sugars for energy. The lack of insulin or the lack of normal response to insulin causes excess sugars to build up in the blood instead of going into the tissue cells. As a result, high blood sugar (hyperglycemia) develops. The effect of high sugar (glucose) levels can cause many complications. Type 2 diabetes was also previously called adult-onset diabetes, but it can occur at any age.  RISK FACTORS  A person is predisposed to developing type 2 diabetes if someone in the family has the disease and also has one or more of the following primary risk factors:  Weight gain, or being overweight or obese.  An inactive lifestyle.  A history of consistently eating high-calorie foods. Maintaining a normal weight and regular physical activity can reduce the chance of developing type 2 diabetes. SYMPTOMS  A person with type 2 diabetes may not show symptoms initially. The symptoms of type 2 diabetes appear slowly. The symptoms include:  Increased thirst (polydipsia).  Increased urination (polyuria).  Increased urination during the night (nocturia).  Sudden or unexplained weight changes.  Frequent, recurring infections.  Tiredness (fatigue).  Weakness.  Vision changes, such as blurred vision.  Fruity smell to your breath.  Abdominal pain.  Nausea or  vomiting.  Cuts or bruises which are slow to heal.  Tingling or numbness in the hands or feet.  An open skin wound (ulcer). DIAGNOSIS Type 2  diabetes is frequently not diagnosed until complications of diabetes are present. Type 2 diabetes is diagnosed when symptoms or complications are present and when blood glucose levels are increased. Your blood glucose level may be checked by one or more of the following blood tests:  A fasting blood glucose test. You will not be allowed to eat for at least 8 hours before a blood sample is taken.  A random blood glucose test. Your blood glucose is checked at any time of the day regardless of when you ate.  A hemoglobin A1c blood glucose test. A hemoglobin A1c test provides information about blood glucose control over the previous 3 months.  An oral glucose tolerance test (OGTT). Your blood glucose is measured after you have not eaten (fasted) for 2 hours and then after you drink a glucose-containing beverage. TREATMENT   You may need to take insulin or diabetes medicine daily to keep blood glucose levels in the desired range.  If you use insulin, you may need to adjust the dosage depending on the carbohydrates that you eat with each meal or snack.  Lifestyle changes are recommended as part of your treatment. These may include:  Following an individualized diet plan developed by a nutritionist or dietitian.  Exercising daily. Your health care providers will set individualized treatment goals for you based on your age, your medicines, how long you have had diabetes, and any other medical conditions you have. Generally, the goal of treatment is to maintain the following blood glucose levels:  Before meals (preprandial): 80-130 mg/dL.  After meals (postprandial): below 180 mg/dL.  A1c: less than 6.5-7%. HOME CARE INSTRUCTIONS   Have your hemoglobin A1c level checked twice a year.  Perform daily blood glucose monitoring as directed by your  health care provider.  Monitor urine ketones when you are ill and as directed by your health care provider.  Take your diabetes medicine or insulin as directed by your health care provider to maintain your blood glucose levels in the desired range.  Never run out of diabetes medicine or insulin. It is needed every day.  If you are using insulin, you may need to adjust the amount of insulin given based on your intake of carbohydrates. Carbohydrates can raise blood glucose levels but need to be included in your diet. Carbohydrates provide vitamins, minerals, and fiber which are an essential part of a healthy diet. Carbohydrates are found in fruits, vegetables, whole grains, dairy products, legumes, and foods containing added sugars.  Eat healthy foods. You should make an appointment to see a registered dietitian to help you create an eating plan that is right for you.  Lose weight if you are overweight.  Carry a medical alert card or wear your medical alert jewelry.  Carry a 15-gram carbohydrate snack with you at all times to treat low blood glucose (hypoglycemia). Some examples of 15-gram carbohydrate snacks include:  Glucose tablets, 3 or 4.  Glucose gel, 15-gram tube.  Raisins, 2 tablespoons (24 grams).  Jelly beans, 6.  Animal crackers, 8.  Regular pop, 4 ounces (120 mL).  Gummy treats, 9.  Recognize hypoglycemia. Hypoglycemia occurs with blood glucose levels of 70 mg/dL and below. The risk for hypoglycemia increases when fasting or skipping meals, during or after intense exercise, and during sleep. Hypoglycemia symptoms can include:  Tremors or shakes.  Decreased ability to concentrate.  Sweating.  Increased heart rate.  Headache.  Dry mouth.  Hunger.  Irritability.  Anxiety.  Restless sleep.  Altered speech or  coordination.  Confusion.  Treat hypoglycemia promptly. If you are alert and able to safely swallow, follow the 15:15 rule:  Take 15-20 grams of  rapid-acting glucose or carbohydrate. Rapid-acting options include glucose gel, glucose tablets, or 4 ounces (120 mL) of fruit juice, regular soda, or low-fat milk.  Check your blood glucose level 15 minutes after taking the glucose.  Take 15-20 grams more of glucose if the repeat blood glucose level is still 70 mg/dL or below.  Eat a meal or snack within 1 hour once blood glucose levels return to normal.  Be alert to feeling very thirsty and urinating more frequently than usual, which are early signs of hyperglycemia. An early awareness of hyperglycemia allows for prompt treatment. Treat hyperglycemia as directed by your health care provider.  Engage in at least 150 minutes of moderate-intensity physical activity a week, spread over at least 3 days of the week or as directed by your health care provider. In addition, you should engage in resistance exercise at least 2 times a week or as directed by your health care provider. Try to spend no more than 90 minutes at one time inactive.  Adjust your medicine and food intake as needed if you start a new exercise or sport.  Follow your sick-day plan anytime you are unable to eat or drink as usual.  Do not use any tobacco products including cigarettes, chewing tobacco, or electronic cigarettes. If you need help quitting, ask your health care provider.  Limit alcohol intake to no more than 1 drink per day for nonpregnant women and 2 drinks per day for men. You should drink alcohol only when you are also eating food. Talk with your health care provider whether alcohol is safe for you. Tell your health care provider if you drink alcohol several times a week.  Keep all follow-up visits as directed by your health care provider. This is important.  Schedule an eye exam soon after the diagnosis of type 2 diabetes and then annually.  Perform daily skin and foot care. Examine your skin and feet daily for cuts, bruises, redness, nail problems, bleeding,  blisters, or sores. A foot exam by a health care provider should be done annually.  Brush your teeth and gums at least twice a day and floss at least once a day. Follow up with your dentist regularly.  Share your diabetes management plan with your workplace or school.  Keep your immunizations up to date. It is recommended that you receive a flu (influenza) vaccine every year. It is also recommended that you receive a pneumonia (pneumococcal) vaccine. If you are 82 years of age or older and have never received a pneumonia vaccine, this vaccine may be given as a series of two separate shots. Ask your health care provider which additional vaccines may be recommended.  Learn to manage stress.  Obtain ongoing diabetes education and support as needed.  Participate in or seek rehabilitation as needed to maintain or improve independence and quality of life. Request a physical or occupational therapy referral if you are having foot or hand numbness, or difficulties with grooming, dressing, eating, or physical activity. SEEK MEDICAL CARE IF:   You are unable to eat food or drink fluids for more than 6 hours.  You have nausea and vomiting for more than 6 hours.  Your blood glucose level is over 240 mg/dL.  There is a change in mental status.  You develop an additional serious illness.  You have diarrhea for more than  6 hours.  You have been sick or have had a fever for a couple of days and are not getting better.  You have pain during any physical activity.  SEEK IMMEDIATE MEDICAL CARE IF:  You have difficulty breathing.  You have moderate to large ketone levels.   This information is not intended to replace advice given to you by your health care provider. Make sure you discuss any questions you have with your health care provider.   Document Released: 06/22/2005 Document Revised: 03/13/2015 Document Reviewed: 01/19/2012 Elsevier Interactive Patient Education [employer].

## 2016-01-01 ENCOUNTER — Other Ambulatory Visit: Payer: Self-pay | Admitting: Internal Medicine

## 2016-02-20 ENCOUNTER — Other Ambulatory Visit: Payer: Self-pay | Admitting: Internal Medicine

## 2016-04-01 ENCOUNTER — Ambulatory Visit (INDEPENDENT_AMBULATORY_CARE_PROVIDER_SITE_OTHER): Payer: Medicare Other | Admitting: Internal Medicine

## 2016-04-01 ENCOUNTER — Encounter: Payer: Self-pay | Admitting: Internal Medicine

## 2016-04-01 VITALS — BP 126/80 | HR 77 | Temp 98.1°F | Resp 20 | Ht 63.5 in | Wt 179.0 lb

## 2016-04-01 DIAGNOSIS — I10 Essential (primary) hypertension: Secondary | ICD-10-CM

## 2016-04-01 DIAGNOSIS — E119 Type 2 diabetes mellitus without complications: Secondary | ICD-10-CM

## 2016-04-01 DIAGNOSIS — Z23 Encounter for immunization: Secondary | ICD-10-CM | POA: Diagnosis not present

## 2016-04-01 DIAGNOSIS — E038 Other specified hypothyroidism: Secondary | ICD-10-CM

## 2016-04-01 LAB — MICROALBUMIN / CREATININE URINE RATIO
CREATININE, U: 199.7 mg/dL
MICROALB/CREAT RATIO: 0.9 mg/g (ref 0.0–30.0)
Microalb, Ur: 1.7 mg/dL (ref 0.0–1.9)

## 2016-04-01 LAB — HEMOGLOBIN A1C: HEMOGLOBIN A1C: 6.1 % (ref 4.6–6.5)

## 2016-04-01 NOTE — Progress Notes (Signed)
Subjective:    Patient ID: Karen Cordova, female    DOB: [birth year], 65 y.o.   MRN: [id number]  HPI  65 year old patient who is seen today for follow-up.  She has type 2 diabetes controlled on submaximal dose of metformin.  She has essential hypertension and hypothyroidism. She is doing fairly well, although does continue to have frequent headaches. She describes some mild occasional vertigo.  Past Medical History:  Diagnosis Date  . ALLERGIC RHINITIS 07/11/2007  . ASTHMA    as a child, no problem as adult, no inhaler  . DIVERTICULOSIS, COLON 07/06/2007  . GERD 07/06/2007  . HYPERTENSION 07/06/2007  . HYPOTHYROIDISM 07/06/2007  . Hypothyroidism   . PARESTHESIA 02/13/2010  . RESTLESS LEG SYNDROME 08/07/2008  . Vitamin D deficiency      Social History   Social History  . Marital status: Married    Spouse name: N/A  . Number of children: N/A  . Years of education: N/A   Occupational History  . Not on file.   Social History Main Topics  . Smoking status: Never Smoker  . Smokeless tobacco: Never Used  . Alcohol use No  . Drug use: No  . Sexual activity: Yes    Birth control/ protection: Post-menopausal   Other Topics Concern  . Not on file   Social History Narrative  . No narrative on file    Past Surgical History:  Procedure Laterality Date  . COLONOSCOPY    . THYROIDECTOMY  1999   partial  . TONSILLECTOMY AND ADENOIDECTOMY  1999  . TUBAL LIGATION  1976  . WISDOM TOOTH EXTRACTION      Family History  Problem Relation Age of Onset  . Colon cancer Neg Hx   . Colon polyps Neg Hx     Allergies  Allergen Reactions  . Shrimp [Shellfish Allergy] Hives  . Codeine Phosphate Nausea Only    Current Outpatient Prescriptions on File Prior to Visit  Medication Sig Dispense Refill  . Calcium Carb-Cholecalciferol (CALCIUM + D3) 600-800 MG-UNIT TABS Take 1 tablet by mouth daily.    Marland Kitchen levothyroxine (SYNTHROID, LEVOTHROID) 50 MCG tablet Take 1 tablet by mouth  daily  90 tablet 1  . lisinopril-hydrochlorothiazide (PRINZIDE,ZESTORETIC) 20-25 MG tablet Take 1 tablet by mouth  daily 90 tablet 1  . metFORMIN (GLUCOPHAGE-XR) 500 MG 24 hr tablet Take 2 tablets (1,000 mg total) by mouth daily with breakfast. 180 tablet 3  . Multiple Vitamins-Minerals (PRESERVISION AREDS 2) CAPS Take 1 capsule by mouth daily.    . vitamin B-12 (CYANOCOBALAMIN) 500 MCG tablet Take 500 mcg by mouth daily.     No current facility-administered medications on file prior to visit.     BP 126/80 (BP Location: Left Arm, Patient Position: Sitting, Cuff Size: Normal)   Pulse 77   Temp 98.1 F (36.7 C) (Oral)   Resp 20   Ht 5' 3.5" (1.613 m)   Wt 179 lb (81.2 kg)   SpO2 98%   BMI 31.21 kg/m     Review of Systems  Constitutional: Negative.   HENT: Negative for congestion, dental problem, hearing loss, rhinorrhea, sinus pressure, sore throat and tinnitus.   Eyes: Negative for pain, discharge and visual disturbance.  Respiratory: Negative for cough and shortness of breath.   Cardiovascular: Negative for chest pain, palpitations and leg swelling.  Gastrointestinal: Negative for abdominal distention, abdominal pain, blood in stool, constipation, diarrhea, nausea and vomiting.  Genitourinary: Negative for difficulty urinating, dysuria, flank pain, frequency, hematuria, pelvic  pain, urgency, vaginal bleeding, vaginal discharge and vaginal pain.  Musculoskeletal: Negative for arthralgias, gait problem and joint swelling.  Skin: Negative for rash.  Neurological: Positive for dizziness and headaches. Negative for syncope, speech difficulty, weakness and numbness.  Hematological: Negative for adenopathy.  Psychiatric/Behavioral: Negative for agitation, behavioral problems and dysphoric mood. The patient is not nervous/anxious.        Objective:   Physical Exam  Constitutional: She is oriented to person, place, and time. She appears well-developed and well-nourished.  HENT:  Head:  Normocephalic.  Right Ear: External ear normal.  Left Ear: External ear normal.  Mouth/Throat: Oropharynx is clear and moist.  Eyes: Conjunctivae and EOM are normal. Pupils are equal, round, and reactive to light.  Neck: Normal range of motion. Neck supple. No thyromegaly present.  Cardiovascular: Normal rate, regular rhythm, normal heart sounds and intact distal pulses.   Pulmonary/Chest: Effort normal and breath sounds normal.  Abdominal: Soft. Bowel sounds are normal. She exhibits no mass. There is no tenderness.  Musculoskeletal: Normal range of motion.  Lymphadenopathy:    She has no cervical adenopathy.  Neurological: She is alert and oriented to person, place, and time.  Skin: Skin is warm and dry. No rash noted.  Psychiatric: She has a normal mood and affect. Her behavior is normal.          Assessment & Plan:   Diabetes mellitus type 2.  Will check hemoglobin A1c Essential hypertension Hypothyroidism  Follow-up 3 months No change in medical regimen  Nyoka Cowden

## 2016-04-01 NOTE — Progress Notes (Signed)
Pre visit review using our clinic review tool, if applicable. No additional management support is needed unless otherwise documented below in the visit note. 

## 2016-04-01 NOTE — Patient Instructions (Signed)
Limit your sodium (Salt) intake   Please check your hemoglobin A1c every 3 months    It is important that you exercise regularly, at least 20 minutes 3 to 4 times per week.  If you develop chest pain or shortness of breath seek  medical attention.  Please see your eye doctor yearly to check for diabetic eye damage  

## 2016-07-08 ENCOUNTER — Encounter: Payer: Self-pay | Admitting: Internal Medicine

## 2016-07-08 ENCOUNTER — Ambulatory Visit (INDEPENDENT_AMBULATORY_CARE_PROVIDER_SITE_OTHER): Payer: Medicare Other | Admitting: Internal Medicine

## 2016-07-08 VITALS — BP 128/80 | HR 72 | Temp 97.9°F | Ht 63.5 in | Wt 179.5 lb

## 2016-07-08 DIAGNOSIS — I10 Essential (primary) hypertension: Secondary | ICD-10-CM | POA: Diagnosis not present

## 2016-07-08 DIAGNOSIS — E119 Type 2 diabetes mellitus without complications: Secondary | ICD-10-CM | POA: Diagnosis not present

## 2016-07-08 LAB — HEMOGLOBIN A1C: Hgb A1c MFr Bld: 6 % (ref 4.6–6.5)

## 2016-07-08 NOTE — Patient Instructions (Signed)
Limit your sodium (Salt) intake  Please check your blood pressure on a regular basis.  If it is consistently greater than 150/90, please make an office appointment.    It is important that you exercise regularly, at least 20 minutes 3 to 4 times per week.  If you develop chest pain or shortness of breath seek  medical attention.  Take a calcium supplement, plus [phone] units of vitamin D   Please check your hemoglobin A1c every 3-6  months

## 2016-07-08 NOTE — Progress Notes (Signed)
Pre visit review using our clinic review tool, if applicable. No additional management support is needed unless otherwise documented below in the visit note. 

## 2016-07-08 NOTE — Progress Notes (Signed)
Subjective:    Patient ID: Karen Cordova, female    DOB: [birth year], 66 y.o.   MRN: [id number]  HPI Lab Results  Component Value Date   HGBA1C 6.1 04/01/2016   66 year old patient seen today for a quarterly follow-up.  She has hypertension and type 2 diabetes.  Both have been under excellent control.  No concerns or complaints.  Denies any cardiopulmonary complaints.  She also has a history of hypothyroidism and remains on supplemental levothyroxine.  Past Medical History:  Diagnosis Date  . ALLERGIC RHINITIS 07/11/2007  . ASTHMA    as a child, no problem as adult, no inhaler  . DIVERTICULOSIS, COLON 07/06/2007  . GERD 07/06/2007  . HYPERTENSION 07/06/2007  . HYPOTHYROIDISM 07/06/2007  . Hypothyroidism   . PARESTHESIA 02/13/2010  . RESTLESS LEG SYNDROME 08/07/2008  . Vitamin D deficiency      Social History   Social History  . Marital status: Married    Spouse name: N/A  . Number of children: N/A  . Years of education: N/A   Occupational History  . Not on file.   Social History Main Topics  . Smoking status: Never Smoker  . Smokeless tobacco: Never Used  . Alcohol use No  . Drug use: No  . Sexual activity: Yes    Birth control/ protection: Post-menopausal   Other Topics Concern  . Not on file   Social History Narrative  . No narrative on file    Past Surgical History:  Procedure Laterality Date  . COLONOSCOPY    . THYROIDECTOMY  1999   partial  . TONSILLECTOMY AND ADENOIDECTOMY  1999  . TUBAL LIGATION  1976  . WISDOM TOOTH EXTRACTION      Family History  Problem Relation Age of Onset  . Colon cancer Neg Hx   . Colon polyps Neg Hx     Allergies  Allergen Reactions  . Shrimp [Shellfish Allergy] Hives  . Codeine Phosphate Nausea Only    Current Outpatient Prescriptions on File Prior to Visit  Medication Sig Dispense Refill  . Calcium Carb-Cholecalciferol (CALCIUM + D3) 600-800 MG-UNIT TABS Take 1 tablet by mouth daily.    Marland Kitchen levothyroxine  (SYNTHROID, LEVOTHROID) 50 MCG tablet Take 1 tablet by mouth  daily 90 tablet 1  . lisinopril-hydrochlorothiazide (PRINZIDE,ZESTORETIC) 20-25 MG tablet Take 1 tablet by mouth  daily 90 tablet 1  . metFORMIN (GLUCOPHAGE-XR) 500 MG 24 hr tablet Take 2 tablets (1,000 mg total) by mouth daily with breakfast. 180 tablet 3  . Multiple Vitamins-Minerals (PRESERVISION AREDS 2) CAPS Take 1 capsule by mouth daily.     No current facility-administered medications on file prior to visit.     BP 128/80 (BP Location: Left Arm, Patient Position: Sitting, Cuff Size: Normal)   Pulse 72   Temp 97.9 F (36.6 C) (Oral)   Ht 5' 3.5" (1.613 m)   Wt 179 lb 8 oz (81.4 kg)   SpO2 98%   BMI 31.30 kg/m      Review of Systems  Constitutional: Negative.   HENT: Negative for congestion, dental problem, hearing loss, rhinorrhea, sinus pressure, sore throat and tinnitus.   Eyes: Negative for pain, discharge and visual disturbance.  Respiratory: Negative for cough and shortness of breath.   Cardiovascular: Negative for chest pain, palpitations and leg swelling.  Gastrointestinal: Negative for abdominal distention, abdominal pain, blood in stool, constipation, diarrhea, nausea and vomiting.  Genitourinary: Negative for difficulty urinating, dysuria, flank pain, frequency, hematuria, pelvic pain, urgency, vaginal bleeding,  vaginal discharge and vaginal pain.  Musculoskeletal: Negative for arthralgias, gait problem and joint swelling.  Skin: Negative for rash.  Neurological: Negative for dizziness, syncope, speech difficulty, weakness, numbness and headaches.  Hematological: Negative for adenopathy.  Psychiatric/Behavioral: Negative for agitation, behavioral problems and dysphoric mood. The patient is not nervous/anxious.        Objective:   Physical Exam  Constitutional: She is oriented to person, place, and time. She appears well-developed and well-nourished.  HENT:  Head: Normocephalic.  Right Ear: External  ear normal.  Left Ear: External ear normal.  Mouth/Throat: Oropharynx is clear and moist.  Eyes: Conjunctivae and EOM are normal. Pupils are equal, round, and reactive to light.  Neck: Normal range of motion. Neck supple. No thyromegaly present.  Cardiovascular: Normal rate, regular rhythm, normal heart sounds and intact distal pulses.   Pulmonary/Chest: Effort normal and breath sounds normal.  Abdominal: Soft. Bowel sounds are normal. She exhibits no mass. There is no tenderness.  Musculoskeletal: Normal range of motion.  Lymphadenopathy:    She has no cervical adenopathy.  Neurological: She is alert and oriented to person, place, and time.  Skin: Skin is warm and dry. No rash noted.  Psychiatric: She has a normal mood and affect. Her behavior is normal.          Assessment & Plan:   Diabetes mellitus.  Will check a hemoglobin A1c.  Recheck 6 months.  If this remains in a nondiabetic range Essential hypertension, stable.  No change in therapy.  Follow-up 6 months  Nyoka Cowden

## 2016-08-27 ENCOUNTER — Other Ambulatory Visit: Payer: Self-pay | Admitting: Internal Medicine

## 2016-12-08 ENCOUNTER — Other Ambulatory Visit: Payer: Self-pay | Admitting: Internal Medicine

## 2016-12-08 DIAGNOSIS — Z1231 Encounter for screening mammogram for malignant neoplasm of breast: Secondary | ICD-10-CM

## 2016-12-21 ENCOUNTER — Ambulatory Visit
Admission: RE | Admit: 2016-12-21 | Discharge: 2016-12-21 | Disposition: A | Discharge status: Home / Self Care | Payer: Medicare Other | Source: Ambulatory Visit | Attending: Internal Medicine | Admitting: Internal Medicine

## 2016-12-21 ENCOUNTER — Other Ambulatory Visit: Payer: Self-pay | Admitting: Internal Medicine

## 2016-12-21 DIAGNOSIS — Z1231 Encounter for screening mammogram for malignant neoplasm of breast: Secondary | ICD-10-CM | POA: Diagnosis not present

## 2016-12-21 DIAGNOSIS — Z0000 Encounter for general adult medical examination without abnormal findings: Secondary | ICD-10-CM

## 2016-12-21 DIAGNOSIS — E039 Hypothyroidism, unspecified: Secondary | ICD-10-CM

## 2016-12-23 ENCOUNTER — Other Ambulatory Visit (INDEPENDENT_AMBULATORY_CARE_PROVIDER_SITE_OTHER): Payer: Medicare Other

## 2016-12-23 DIAGNOSIS — Z0000 Encounter for general adult medical examination without abnormal findings: Secondary | ICD-10-CM

## 2016-12-23 DIAGNOSIS — E039 Hypothyroidism, unspecified: Secondary | ICD-10-CM | POA: Diagnosis not present

## 2016-12-23 LAB — LIPID PANEL
CHOLESTEROL: 213 mg/dL — AB (ref 0–200)
HDL: 46 mg/dL (ref >39.00)
LDL Cholesterol: 132 mg/dL — ABNORMAL HIGH (ref 0–99)
NonHDL: 167.06
Total CHOL/HDL Ratio: 5
Triglycerides: 173 mg/dL — ABNORMAL HIGH (ref 0.0–149.0)
VLDL: 34.6 mg/dL (ref 0.0–40.0)

## 2016-12-23 LAB — HEPATIC FUNCTION PANEL
ALBUMIN: 4.3 g/dL (ref 3.5–5.2)
ALK PHOS: 89 U/L (ref 39–117)
ALT: 33 U/L (ref 0–35)
AST: 44 U/L — AB (ref 0–37)
Bilirubin, Direct: 0.1 mg/dL (ref 0.0–0.3)
Total Bilirubin: 0.6 mg/dL (ref 0.2–1.2)
Total Protein: 6.8 g/dL (ref 6.0–8.3)

## 2016-12-23 LAB — CBC
HCT: 40.4 % (ref 36.0–46.0)
Hemoglobin: 13.6 g/dL (ref 12.0–15.0)
MCHC: 33.7 g/dL (ref 30.0–36.0)
MCV: 91.3 fl (ref 78.0–100.0)
Platelets: 170 10*3/uL (ref 150.0–400.0)
RBC: 4.42 Mil/uL (ref 3.87–5.11)
RDW: 13.1 % (ref 11.5–15.5)
WBC: 4.5 10*3/uL (ref 4.0–10.5)

## 2016-12-23 LAB — COMPREHENSIVE METABOLIC PANEL
ALBUMIN: 4.3 g/dL (ref 3.5–5.2)
ALK PHOS: 89 U/L (ref 39–117)
ALT: 33 U/L (ref 0–35)
AST: 44 U/L — AB (ref 0–37)
BILIRUBIN TOTAL: 0.6 mg/dL (ref 0.2–1.2)
BUN: 19 mg/dL (ref 6–23)
CO2: 30 mEq/L (ref 19–32)
CREATININE: 0.82 mg/dL (ref 0.40–1.20)
Calcium: 9.6 mg/dL (ref 8.4–10.5)
Chloride: 104 mEq/L (ref 96–112)
GFR: 73.72 mL/min (ref >60.00)
GLUCOSE: 123 mg/dL — AB (ref 70–99)
Potassium: 3.8 mEq/L (ref 3.5–5.1)
SODIUM: 142 meq/L (ref 135–145)
Total Protein: 6.8 g/dL (ref 6.0–8.3)

## 2016-12-23 LAB — TSH: TSH: 0.91 u[IU]/mL (ref 0.35–4.50)

## 2016-12-30 ENCOUNTER — Ambulatory Visit (INDEPENDENT_AMBULATORY_CARE_PROVIDER_SITE_OTHER): Payer: Medicare Other | Admitting: Internal Medicine

## 2016-12-30 ENCOUNTER — Encounter: Payer: Medicare Other | Admitting: Internal Medicine

## 2016-12-30 ENCOUNTER — Encounter: Payer: Self-pay | Admitting: Internal Medicine

## 2016-12-30 VITALS — BP 122/74 | HR 84 | Temp 98.1°F | Ht 63.5 in | Wt 180.2 lb

## 2016-12-30 DIAGNOSIS — E119 Type 2 diabetes mellitus without complications: Secondary | ICD-10-CM

## 2016-12-30 DIAGNOSIS — Z0000 Encounter for general adult medical examination without abnormal findings: Secondary | ICD-10-CM

## 2016-12-30 LAB — POCT GLYCOSYLATED HEMOGLOBIN (HGB A1C): HEMOGLOBIN A1C: 5.8

## 2016-12-30 NOTE — Progress Notes (Signed)
Subjective:    Patient ID: Karen Cordova, female    DOB: [birth year], 66 y.o.   MRN: [id number]  HPI  66 year old patient who is seen today for a preventive health examination and subsequent Medicare wellness visit She has type 2 diabetes which has been well managed with metformin therapy alone.  She has essential hypertension.  She has a history of asthma, which has been stable.  She has hypothyroidism. No concerns or complaints Her last Pap smear was 2 years ago Has had a mammogram earlier this year and is scheduled for an eye examination this summer  Past Medical History:  Diagnosis Date  . ALLERGIC RHINITIS 07/11/2007  . ASTHMA    as a child, no problem as adult, no inhaler  . DIVERTICULOSIS, COLON 07/06/2007  . GERD 07/06/2007  . HYPERTENSION 07/06/2007  . HYPOTHYROIDISM 07/06/2007  . Hypothyroidism   . PARESTHESIA 02/13/2010  . RESTLESS LEG SYNDROME 08/07/2008  . Vitamin D deficiency      Social History   Social History  . Marital status: Married    Spouse name: N/A  . Number of children: N/A  . Years of education: N/A   Occupational History  . Not on file.   Social History Main Topics  . Smoking status: Never Smoker  . Smokeless tobacco: Never Used  . Alcohol use No  . Drug use: No  . Sexual activity: Yes    Birth control/ protection: Post-menopausal   Other Topics Concern  . Not on file   Social History Narrative  . No narrative on file    Past Surgical History:  Procedure Laterality Date  . COLONOSCOPY    . THYROIDECTOMY  1999   partial  . TONSILLECTOMY AND ADENOIDECTOMY  1999  . TUBAL LIGATION  1976  . WISDOM TOOTH EXTRACTION      Family History  Problem Relation Age of Onset  . Colon cancer Neg Hx   . Colon polyps Neg Hx     Allergies  Allergen Reactions  . Shrimp [Shellfish Allergy] Hives  . Codeine Phosphate Nausea Only    Current Outpatient Prescriptions on File Prior to Visit  Medication Sig Dispense Refill  . Calcium  Carb-Cholecalciferol (CALCIUM + D3) 600-800 MG-UNIT TABS Take 1 tablet by mouth daily.    Marland Kitchen levothyroxine (SYNTHROID, LEVOTHROID) 50 MCG tablet TAKE 1 TABLET BY MOUTH  DAILY 90 tablet 2  . lisinopril-hydrochlorothiazide (PRINZIDE,ZESTORETIC) 20-25 MG tablet TAKE 1 TABLET BY MOUTH  DAILY 90 tablet 2  . metFORMIN (GLUCOPHAGE-XR) 500 MG 24 hr tablet Take 2 tablets (1,000 mg total) by mouth daily with breakfast. 180 tablet 3  . Multiple Vitamins-Minerals (PRESERVISION AREDS 2) CAPS Take 1 capsule by mouth daily.     No current facility-administered medications on file prior to visit.     BP 122/74 (BP Location: Left Arm, Patient Position: Sitting, Cuff Size: Normal)   Pulse 84   Temp 98.1 F (36.7 C) (Oral)   Ht 5' 3.5" (1.613 m)   Wt 180 lb 3.2 oz (81.7 kg)   SpO2 97%   BMI 31.42 kg/m   Medicare wellness visit  1. Risk factors, based on past  M,S,F history.  Current vascular risk factors include diabetes and hypertension  2.  Physical activities:remains quite active without exercise limitations  3.  Depression/mood:.  No history of major depression or mood disorder  4.  Hearing:no deficits  5.  ADL's:independent  6.  Fall risk:low  7.  Home safety:no problems identified  8.  Height weight, and visual acuity;height and weight stable no change in visual acuity  9.  Counseling:more rigorous exercise modest weight loss encouraged  10. Lab orders based on risk factors:laboratory profile reviewed.  Will check hemoglobin A1c  11. Referral :follow-up eye examination recommended  12. Care plan:continue efforts at aggressive risk factor modification  13. Cognitive assessment: alert and oriented with normal affect no cognitive dysfunction  14. Screening: Patient provided with a written and personalized 5-10 year screening schedule in the AVS.    15. Provider List Update: primary careGI and ophthalmology    Review of Systems  Constitutional: Negative.   HENT: Negative for  congestion, dental problem, hearing loss, rhinorrhea, sinus pressure, sore throat and tinnitus.   Eyes: Negative for pain, discharge and visual disturbance.  Respiratory: Negative for cough and shortness of breath.   Cardiovascular: Negative for chest pain, palpitations and leg swelling.  Gastrointestinal: Negative for abdominal distention, abdominal pain, blood in stool, constipation, diarrhea, nausea and vomiting.  Genitourinary: Negative for difficulty urinating, dysuria, flank pain, frequency, hematuria, pelvic pain, urgency, vaginal bleeding, vaginal discharge and vaginal pain.  Musculoskeletal: Negative for arthralgias, gait problem and joint swelling.  Skin: Negative for rash.  Neurological: Negative for dizziness, syncope, speech difficulty, weakness, numbness and headaches.  Hematological: Negative for adenopathy.  Psychiatric/Behavioral: Negative for agitation, behavioral problems and dysphoric mood. The patient is not nervous/anxious.        Objective:   Physical Exam  Constitutional: She is oriented to person, place, and time. She appears well-developed and well-nourished.  Weight 180  HENT:  Head: Normocephalic and atraumatic.  Right Ear: External ear normal.  Left Ear: External ear normal.  Mouth/Throat: Oropharynx is clear and moist.  Eyes: Conjunctivae and EOM are normal.  Neck: Normal range of motion. Neck supple. No JVD present. No thyromegaly present.  Cardiovascular: Normal rate, regular rhythm, normal heart sounds and intact distal pulses.   No murmur heard. Pulmonary/Chest: Effort normal and breath sounds normal. She has no wheezes. She has no rales.  Abdominal: Soft. Bowel sounds are normal. She exhibits no distension and no mass. There is no tenderness. There is no rebound and no guarding.  Genitourinary: Vagina normal.  Musculoskeletal: Normal range of motion. She exhibits no edema or tenderness.  Neurological: She is alert and oriented to person, place, and  time. She has normal reflexes. No cranial nerve deficit. She exhibits normal muscle tone. Coordination normal.  Skin: Skin is warm and dry. No rash noted.  Psychiatric: She has a normal mood and affect. Her behavior is normal.          Assessment & Plan:   Preventive health examination Subsequent annual Medicare wellness visit Essential hypertension, stable Diabetes mellitus.  Will review a hemoglobin A1c.  Anuli examination recommended  Statin therapy discussed.  Patient wishes to defer Hypothyroidism.  TSH therapeutic  Follow-up 6 months  Sloane Palmer Pilar Plate

## 2016-12-30 NOTE — Patient Instructions (Signed)
Limit your sodium (Salt) intake  Please check your blood pressure on a regular basis.  If it is consistently greater than 150/90, please make an office appointment.    It is important that you exercise regularly, at least 20 minutes 3 to 4 times per week.  If you develop chest pain or shortness of breath seek  medical attention.  You need to lose weight.  Consider a lower calorie diet and regular exercise.   Please check your hemoglobin A1c every 3-6  months

## 2017-01-19 ENCOUNTER — Telehealth: Payer: Self-pay | Admitting: Internal Medicine

## 2017-01-19 MED ORDER — METFORMIN HCL ER 500 MG PO TB24: 1000.0000 mg | ORAL_TABLET | Freq: Every day | ORAL | 0 refills | Status: DC

## 2017-01-19 NOTE — Telephone Encounter (Signed)
° ° °  Pt call to ask if she is suppose to taking the below med   metFORMIN (GLUCOPHAGE-XR) 500 MG 24 hr tablet   She said she thought he told her she nno longer has to take this med

## 2017-01-19 NOTE — Telephone Encounter (Signed)
Spoke with patient and asked her  to continue metformin until 3 months prior to her December follow-up visit.  We will check hemoglobin A1c at that time. Patient verbalized understanding.

## 2017-01-19 NOTE — Telephone Encounter (Signed)
Please advise 

## 2017-01-19 NOTE — Telephone Encounter (Signed)
Ask  patient to continue metformin until 3 months prior to her December follow-up visit.  We will check hemoglobin A1c at that time

## 2017-03-16 DIAGNOSIS — H353121 Nonexudative age-related macular degeneration, left eye, early dry stage: Secondary | ICD-10-CM | POA: Diagnosis not present

## 2017-03-16 DIAGNOSIS — H02834 Dermatochalasis of left upper eyelid: Secondary | ICD-10-CM | POA: Diagnosis not present

## 2017-03-16 DIAGNOSIS — E119 Type 2 diabetes mellitus without complications: Secondary | ICD-10-CM | POA: Diagnosis not present

## 2017-03-16 DIAGNOSIS — H02831 Dermatochalasis of right upper eyelid: Secondary | ICD-10-CM | POA: Diagnosis not present

## 2017-03-16 DIAGNOSIS — H353111 Nonexudative age-related macular degeneration, right eye, early dry stage: Secondary | ICD-10-CM | POA: Diagnosis not present

## 2017-03-16 LAB — HM DIABETES EYE EXAM

## 2017-03-23 ENCOUNTER — Encounter: Payer: Self-pay | Admitting: Internal Medicine

## 2017-03-25 ENCOUNTER — Encounter: Payer: Self-pay | Admitting: Internal Medicine

## 2017-05-24 ENCOUNTER — Other Ambulatory Visit: Payer: Self-pay | Admitting: Internal Medicine

## 2017-06-16 ENCOUNTER — Other Ambulatory Visit: Payer: Self-pay | Admitting: Internal Medicine

## 2017-06-21 ENCOUNTER — Telehealth: Payer: Self-pay | Admitting: Internal Medicine

## 2017-06-21 NOTE — Telephone Encounter (Signed)
Copied from [place] [id number]. Topic: Quick Communication - See Telephone Encounter >> Jun 21, 2017 11:43 AM Cleaster Corin, NT wrote: CRM for notification. See Telephone encounter for:   06/21/17. Tammy from [place] [facility] calling to see if Dr. Burnice Logan can reevaluate pt. For  statin therapy. [phone]

## 2017-06-23 MED ORDER — ATORVASTATIN CALCIUM 20 MG PO TABS: 20.0000 mg | ORAL_TABLET | Freq: Every day | ORAL | 3 refills | Status: AC

## 2017-06-23 NOTE — Telephone Encounter (Signed)
Called patient and left message to return call

## 2017-06-23 NOTE — Telephone Encounter (Signed)
Atorvastatin 20 mg daily.  If patient has questions or concerns okay to wait until next office visit to discuss further

## 2017-06-23 NOTE — Telephone Encounter (Signed)
Medication filled to pharmacy as requested.   

## 2017-06-23 NOTE — Telephone Encounter (Signed)
Patient returned call, informed of provider note to start on Atorvastatin, patient agrees and would like prescription sent to [facility].

## 2017-06-23 NOTE — Telephone Encounter (Signed)
THN recommends patient be prescribed a statin due to having a diagnosis of diabetes. If you agree, please advise medication and I will contact patient and send to pharmacy.  Thanks! 

## 2017-07-12 ENCOUNTER — Ambulatory Visit: Payer: Medicare Other | Admitting: Internal Medicine

## 2017-08-05 ENCOUNTER — Other Ambulatory Visit: Payer: Self-pay | Admitting: Internal Medicine

## 2017-10-28 ENCOUNTER — Other Ambulatory Visit: Payer: Self-pay | Admitting: Internal Medicine

## 2017-11-18 ENCOUNTER — Other Ambulatory Visit: Payer: Self-pay | Admitting: Emergency Medicine

## 2017-11-18 DIAGNOSIS — Z1231 Encounter for screening mammogram for malignant neoplasm of breast: Secondary | ICD-10-CM

## 2017-12-29 ENCOUNTER — Ambulatory Visit: Payer: Medicare Other

## 2018-01-05 ENCOUNTER — Encounter: Payer: Medicare Other | Admitting: Internal Medicine

## 2018-01-05 DIAGNOSIS — I10 Essential (primary) hypertension: Secondary | ICD-10-CM | POA: Diagnosis not present

## 2018-01-05 DIAGNOSIS — Z7984 Long term (current) use of oral hypoglycemic drugs: Secondary | ICD-10-CM | POA: Diagnosis not present

## 2018-01-05 DIAGNOSIS — E119 Type 2 diabetes mellitus without complications: Secondary | ICD-10-CM | POA: Diagnosis not present

## 2018-01-05 DIAGNOSIS — E7800 Pure hypercholesterolemia, unspecified: Secondary | ICD-10-CM | POA: Diagnosis not present

## 2018-01-05 DIAGNOSIS — E890 Postprocedural hypothyroidism: Secondary | ICD-10-CM | POA: Diagnosis not present

## 2018-03-14 ENCOUNTER — Ambulatory Visit
Admission: RE | Admit: 2018-03-14 | Discharge: 2018-03-14 | Disposition: A | Discharge status: Home / Self Care | Payer: Medicare Other | Source: Ambulatory Visit | Attending: Emergency Medicine | Admitting: Emergency Medicine

## 2018-03-14 DIAGNOSIS — Z1231 Encounter for screening mammogram for malignant neoplasm of breast: Secondary | ICD-10-CM | POA: Diagnosis not present

## 2018-03-16 DIAGNOSIS — H353111 Nonexudative age-related macular degeneration, right eye, early dry stage: Secondary | ICD-10-CM | POA: Diagnosis not present

## 2018-03-16 DIAGNOSIS — E119 Type 2 diabetes mellitus without complications: Secondary | ICD-10-CM | POA: Diagnosis not present

## 2018-03-16 DIAGNOSIS — H02834 Dermatochalasis of left upper eyelid: Secondary | ICD-10-CM | POA: Diagnosis not present

## 2018-03-16 DIAGNOSIS — H02831 Dermatochalasis of right upper eyelid: Secondary | ICD-10-CM | POA: Diagnosis not present

## 2018-03-16 DIAGNOSIS — H2513 Age-related nuclear cataract, bilateral: Secondary | ICD-10-CM | POA: Diagnosis not present

## 2018-04-07 ENCOUNTER — Other Ambulatory Visit: Payer: Self-pay | Admitting: Family Medicine

## 2018-04-07 DIAGNOSIS — Z1231 Encounter for screening mammogram for malignant neoplasm of breast: Secondary | ICD-10-CM

## 2018-09-15 ENCOUNTER — Other Ambulatory Visit: Payer: Self-pay | Admitting: Gastroenterology

## 2018-09-15 DIAGNOSIS — D1803 Hemangioma of intra-abdominal structures: Secondary | ICD-10-CM

## 2018-09-15 DIAGNOSIS — R740 Nonspecific elevation of levels of transaminase and lactic acid dehydrogenase [LDH]: Principal | ICD-10-CM

## 2018-09-15 DIAGNOSIS — R7401 Elevation of levels of liver transaminase levels: Secondary | ICD-10-CM

## 2018-09-19 ENCOUNTER — Ambulatory Visit
Admission: RE | Admit: 2018-09-19 | Discharge: 2018-09-19 | Disposition: A | Discharge status: Home / Self Care | Payer: Medicare Other | Source: Ambulatory Visit | Attending: Gastroenterology | Admitting: Gastroenterology

## 2018-09-19 DIAGNOSIS — R740 Nonspecific elevation of levels of transaminase and lactic acid dehydrogenase [LDH]: Principal | ICD-10-CM

## 2018-09-19 DIAGNOSIS — D1803 Hemangioma of intra-abdominal structures: Secondary | ICD-10-CM

## 2018-09-19 DIAGNOSIS — R7401 Elevation of levels of liver transaminase levels: Secondary | ICD-10-CM

## 2019-06-08 ENCOUNTER — Other Ambulatory Visit: Payer: Self-pay | Admitting: Family Medicine

## 2019-06-08 DIAGNOSIS — Z1231 Encounter for screening mammogram for malignant neoplasm of breast: Secondary | ICD-10-CM

## 2019-06-08 DIAGNOSIS — E2839 Other primary ovarian failure: Secondary | ICD-10-CM

## 2019-08-20 ENCOUNTER — Ambulatory Visit: Payer: Medicare Other

## 2019-09-07 ENCOUNTER — Ambulatory Visit
Admission: RE | Admit: 2019-09-07 | Discharge: 2019-09-07 | Disposition: A | Discharge status: Home / Self Care | Payer: Medicare Other | Source: Ambulatory Visit | Attending: Family Medicine | Admitting: Family Medicine

## 2019-09-07 ENCOUNTER — Other Ambulatory Visit: Payer: Self-pay

## 2019-09-07 DIAGNOSIS — E2839 Other primary ovarian failure: Secondary | ICD-10-CM

## 2019-09-07 DIAGNOSIS — Z1231 Encounter for screening mammogram for malignant neoplasm of breast: Secondary | ICD-10-CM

## 2020-08-22 DIAGNOSIS — E7800 Pure hypercholesterolemia, unspecified: Secondary | ICD-10-CM | POA: Diagnosis not present

## 2020-08-22 DIAGNOSIS — E890 Postprocedural hypothyroidism: Secondary | ICD-10-CM | POA: Diagnosis not present

## 2020-08-22 DIAGNOSIS — I10 Essential (primary) hypertension: Secondary | ICD-10-CM | POA: Diagnosis not present

## 2020-08-22 DIAGNOSIS — E1169 Type 2 diabetes mellitus with other specified complication: Secondary | ICD-10-CM | POA: Diagnosis not present

## 2020-08-22 DIAGNOSIS — M8580 Other specified disorders of bone density and structure, unspecified site: Secondary | ICD-10-CM | POA: Diagnosis not present

## 2020-08-23 ENCOUNTER — Other Ambulatory Visit: Payer: Self-pay | Admitting: Family Medicine

## 2020-08-23 DIAGNOSIS — Z1231 Encounter for screening mammogram for malignant neoplasm of breast: Secondary | ICD-10-CM

## 2020-09-16 DIAGNOSIS — E1169 Type 2 diabetes mellitus with other specified complication: Secondary | ICD-10-CM | POA: Diagnosis not present

## 2020-09-25 DIAGNOSIS — E7800 Pure hypercholesterolemia, unspecified: Secondary | ICD-10-CM | POA: Diagnosis not present

## 2020-09-25 DIAGNOSIS — I10 Essential (primary) hypertension: Secondary | ICD-10-CM | POA: Diagnosis not present

## 2020-09-25 DIAGNOSIS — E1169 Type 2 diabetes mellitus with other specified complication: Secondary | ICD-10-CM | POA: Diagnosis not present

## 2020-09-25 DIAGNOSIS — M8580 Other specified disorders of bone density and structure, unspecified site: Secondary | ICD-10-CM | POA: Diagnosis not present

## 2020-09-25 DIAGNOSIS — E890 Postprocedural hypothyroidism: Secondary | ICD-10-CM | POA: Diagnosis not present

## 2020-09-27 DIAGNOSIS — Z1231 Encounter for screening mammogram for malignant neoplasm of breast: Secondary | ICD-10-CM

## 2020-10-28 DIAGNOSIS — E7800 Pure hypercholesterolemia, unspecified: Secondary | ICD-10-CM | POA: Diagnosis not present

## 2020-10-28 DIAGNOSIS — M8580 Other specified disorders of bone density and structure, unspecified site: Secondary | ICD-10-CM | POA: Diagnosis not present

## 2020-10-28 DIAGNOSIS — I10 Essential (primary) hypertension: Secondary | ICD-10-CM | POA: Diagnosis not present

## 2020-10-28 DIAGNOSIS — E890 Postprocedural hypothyroidism: Secondary | ICD-10-CM | POA: Diagnosis not present

## 2020-10-28 DIAGNOSIS — E1169 Type 2 diabetes mellitus with other specified complication: Secondary | ICD-10-CM | POA: Diagnosis not present

## 2020-11-04 DIAGNOSIS — E890 Postprocedural hypothyroidism: Secondary | ICD-10-CM | POA: Diagnosis not present

## 2020-11-04 DIAGNOSIS — M8580 Other specified disorders of bone density and structure, unspecified site: Secondary | ICD-10-CM | POA: Diagnosis not present

## 2020-11-04 DIAGNOSIS — E7800 Pure hypercholesterolemia, unspecified: Secondary | ICD-10-CM | POA: Diagnosis not present

## 2020-11-04 DIAGNOSIS — I10 Essential (primary) hypertension: Secondary | ICD-10-CM | POA: Diagnosis not present

## 2020-11-04 DIAGNOSIS — E1169 Type 2 diabetes mellitus with other specified complication: Secondary | ICD-10-CM | POA: Diagnosis not present

## 2020-12-10 DIAGNOSIS — E1169 Type 2 diabetes mellitus with other specified complication: Secondary | ICD-10-CM | POA: Diagnosis not present

## 2020-12-10 DIAGNOSIS — E7800 Pure hypercholesterolemia, unspecified: Secondary | ICD-10-CM | POA: Diagnosis not present

## 2020-12-10 DIAGNOSIS — E041 Nontoxic single thyroid nodule: Secondary | ICD-10-CM | POA: Diagnosis not present

## 2020-12-10 DIAGNOSIS — I10 Essential (primary) hypertension: Secondary | ICD-10-CM | POA: Diagnosis not present

## 2020-12-10 DIAGNOSIS — E890 Postprocedural hypothyroidism: Secondary | ICD-10-CM | POA: Diagnosis not present

## 2020-12-26 DIAGNOSIS — M8580 Other specified disorders of bone density and structure, unspecified site: Secondary | ICD-10-CM | POA: Diagnosis not present

## 2020-12-26 DIAGNOSIS — E7800 Pure hypercholesterolemia, unspecified: Secondary | ICD-10-CM | POA: Diagnosis not present

## 2020-12-26 DIAGNOSIS — E890 Postprocedural hypothyroidism: Secondary | ICD-10-CM | POA: Diagnosis not present

## 2020-12-26 DIAGNOSIS — E1169 Type 2 diabetes mellitus with other specified complication: Secondary | ICD-10-CM | POA: Diagnosis not present

## 2020-12-26 DIAGNOSIS — I10 Essential (primary) hypertension: Secondary | ICD-10-CM | POA: Diagnosis not present

## 2020-12-31 ENCOUNTER — Ambulatory Visit
Admission: RE | Admit: 2020-12-31 | Discharge: 2020-12-31 | Disposition: A | Discharge status: Home / Self Care | Payer: Medicare Other | Source: Ambulatory Visit | Attending: Family Medicine | Admitting: Family Medicine

## 2020-12-31 ENCOUNTER — Other Ambulatory Visit: Payer: Self-pay

## 2020-12-31 DIAGNOSIS — Z1231 Encounter for screening mammogram for malignant neoplasm of breast: Secondary | ICD-10-CM

## 2021-02-12 DIAGNOSIS — H02831 Dermatochalasis of right upper eyelid: Secondary | ICD-10-CM | POA: Diagnosis not present

## 2021-02-12 DIAGNOSIS — H353111 Nonexudative age-related macular degeneration, right eye, early dry stage: Secondary | ICD-10-CM | POA: Diagnosis not present

## 2021-02-12 DIAGNOSIS — E119 Type 2 diabetes mellitus without complications: Secondary | ICD-10-CM | POA: Diagnosis not present

## 2021-02-12 DIAGNOSIS — Q141 Congenital malformation of retina: Secondary | ICD-10-CM | POA: Diagnosis not present

## 2021-02-12 DIAGNOSIS — H02834 Dermatochalasis of left upper eyelid: Secondary | ICD-10-CM | POA: Diagnosis not present

## 2021-02-12 DIAGNOSIS — H04123 Dry eye syndrome of bilateral lacrimal glands: Secondary | ICD-10-CM | POA: Diagnosis not present

## 2021-02-12 DIAGNOSIS — H2513 Age-related nuclear cataract, bilateral: Secondary | ICD-10-CM | POA: Diagnosis not present

## 2021-02-26 DIAGNOSIS — E119 Type 2 diabetes mellitus without complications: Secondary | ICD-10-CM | POA: Diagnosis not present

## 2021-02-26 DIAGNOSIS — E039 Hypothyroidism, unspecified: Secondary | ICD-10-CM | POA: Diagnosis not present

## 2021-02-26 DIAGNOSIS — E041 Nontoxic single thyroid nodule: Secondary | ICD-10-CM | POA: Diagnosis not present

## 2021-02-27 ENCOUNTER — Other Ambulatory Visit: Payer: Self-pay | Admitting: Internal Medicine

## 2021-02-27 DIAGNOSIS — E041 Nontoxic single thyroid nodule: Secondary | ICD-10-CM

## 2021-02-28 ENCOUNTER — Ambulatory Visit
Admission: RE | Admit: 2021-02-28 | Discharge: 2021-02-28 | Disposition: A | Discharge status: Home / Self Care | Payer: Medicare Other | Source: Ambulatory Visit | Attending: Internal Medicine | Admitting: Internal Medicine

## 2021-02-28 DIAGNOSIS — E042 Nontoxic multinodular goiter: Secondary | ICD-10-CM | POA: Diagnosis not present

## 2021-02-28 DIAGNOSIS — E041 Nontoxic single thyroid nodule: Secondary | ICD-10-CM

## 2021-03-03 DIAGNOSIS — E1169 Type 2 diabetes mellitus with other specified complication: Secondary | ICD-10-CM | POA: Diagnosis not present

## 2021-03-03 DIAGNOSIS — E890 Postprocedural hypothyroidism: Secondary | ICD-10-CM | POA: Diagnosis not present

## 2021-03-03 DIAGNOSIS — M8580 Other specified disorders of bone density and structure, unspecified site: Secondary | ICD-10-CM | POA: Diagnosis not present

## 2021-03-03 DIAGNOSIS — I10 Essential (primary) hypertension: Secondary | ICD-10-CM | POA: Diagnosis not present

## 2021-03-03 DIAGNOSIS — E7800 Pure hypercholesterolemia, unspecified: Secondary | ICD-10-CM | POA: Diagnosis not present

## 2021-04-04 DIAGNOSIS — E1169 Type 2 diabetes mellitus with other specified complication: Secondary | ICD-10-CM | POA: Diagnosis not present

## 2021-04-04 DIAGNOSIS — E890 Postprocedural hypothyroidism: Secondary | ICD-10-CM | POA: Diagnosis not present

## 2021-04-04 DIAGNOSIS — I10 Essential (primary) hypertension: Secondary | ICD-10-CM | POA: Diagnosis not present

## 2021-04-04 DIAGNOSIS — E7800 Pure hypercholesterolemia, unspecified: Secondary | ICD-10-CM | POA: Diagnosis not present

## 2021-04-04 DIAGNOSIS — M8580 Other specified disorders of bone density and structure, unspecified site: Secondary | ICD-10-CM | POA: Diagnosis not present

## 2021-05-04 DIAGNOSIS — M8580 Other specified disorders of bone density and structure, unspecified site: Secondary | ICD-10-CM | POA: Diagnosis not present

## 2021-05-04 DIAGNOSIS — E7800 Pure hypercholesterolemia, unspecified: Secondary | ICD-10-CM | POA: Diagnosis not present

## 2021-05-04 DIAGNOSIS — E890 Postprocedural hypothyroidism: Secondary | ICD-10-CM | POA: Diagnosis not present

## 2021-05-04 DIAGNOSIS — E1169 Type 2 diabetes mellitus with other specified complication: Secondary | ICD-10-CM | POA: Diagnosis not present

## 2021-05-04 DIAGNOSIS — I10 Essential (primary) hypertension: Secondary | ICD-10-CM | POA: Diagnosis not present

## 2021-06-12 DIAGNOSIS — Z0000 Encounter for general adult medical examination without abnormal findings: Secondary | ICD-10-CM | POA: Diagnosis not present

## 2021-06-12 DIAGNOSIS — E890 Postprocedural hypothyroidism: Secondary | ICD-10-CM | POA: Diagnosis not present

## 2021-06-12 DIAGNOSIS — I10 Essential (primary) hypertension: Secondary | ICD-10-CM | POA: Diagnosis not present

## 2021-06-12 DIAGNOSIS — E1169 Type 2 diabetes mellitus with other specified complication: Secondary | ICD-10-CM | POA: Diagnosis not present

## 2021-06-12 DIAGNOSIS — M8580 Other specified disorders of bone density and structure, unspecified site: Secondary | ICD-10-CM | POA: Diagnosis not present

## 2021-06-12 DIAGNOSIS — E7800 Pure hypercholesterolemia, unspecified: Secondary | ICD-10-CM | POA: Diagnosis not present

## 2021-07-16 DIAGNOSIS — E7800 Pure hypercholesterolemia, unspecified: Secondary | ICD-10-CM | POA: Diagnosis not present

## 2021-07-16 DIAGNOSIS — E1169 Type 2 diabetes mellitus with other specified complication: Secondary | ICD-10-CM | POA: Diagnosis not present

## 2021-07-16 DIAGNOSIS — E890 Postprocedural hypothyroidism: Secondary | ICD-10-CM | POA: Diagnosis not present

## 2021-07-16 DIAGNOSIS — I10 Essential (primary) hypertension: Secondary | ICD-10-CM | POA: Diagnosis not present

## 2021-08-06 DIAGNOSIS — D485 Neoplasm of uncertain behavior of skin: Secondary | ICD-10-CM | POA: Diagnosis not present

## 2021-08-06 DIAGNOSIS — D229 Melanocytic nevi, unspecified: Secondary | ICD-10-CM | POA: Diagnosis not present

## 2021-11-18 ENCOUNTER — Other Ambulatory Visit: Payer: Self-pay | Admitting: Family Medicine

## 2021-11-18 DIAGNOSIS — Z1231 Encounter for screening mammogram for malignant neoplasm of breast: Secondary | ICD-10-CM

## 2022-01-01 ENCOUNTER — Ambulatory Visit
Admission: RE | Admit: 2022-01-01 | Discharge: 2022-01-01 | Disposition: A | Discharge status: Home / Self Care | Payer: Medicare Other | Source: Ambulatory Visit | Attending: Family Medicine | Admitting: Family Medicine

## 2022-01-01 DIAGNOSIS — Z1231 Encounter for screening mammogram for malignant neoplasm of breast: Secondary | ICD-10-CM

## 2022-01-05 IMAGING — MG MM DIGITAL SCREENING BILAT W/ TOMO AND CAD
8 series · 8 of 24 positions shown · non-contrast
Comparison: Previous exam(s).

CLINICAL DATA: Screening.

EXAM:
DIGITAL SCREENING BILATERAL MAMMOGRAM WITH TOMOSYNTHESIS AND CAD
TECHNIQUE: Bilateral screening digital craniocaudal and mediolateral oblique
mammograms were obtained. Bilateral screening digital breast
tomosynthesis was performed. The images were evaluated with
computer-aided detection.

[L MLO synth-2D]
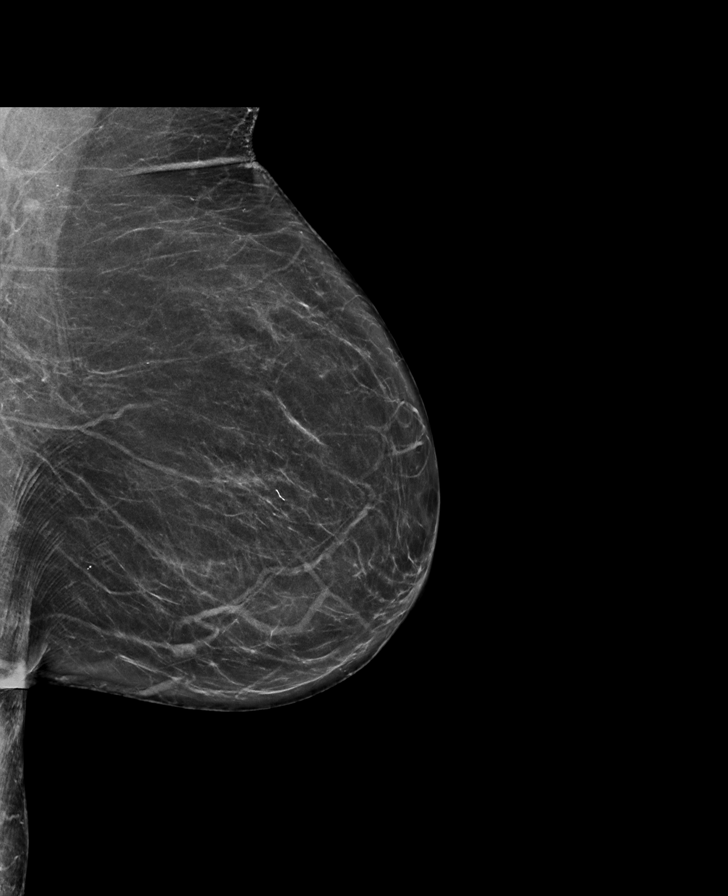

[R MLO synth-2D]
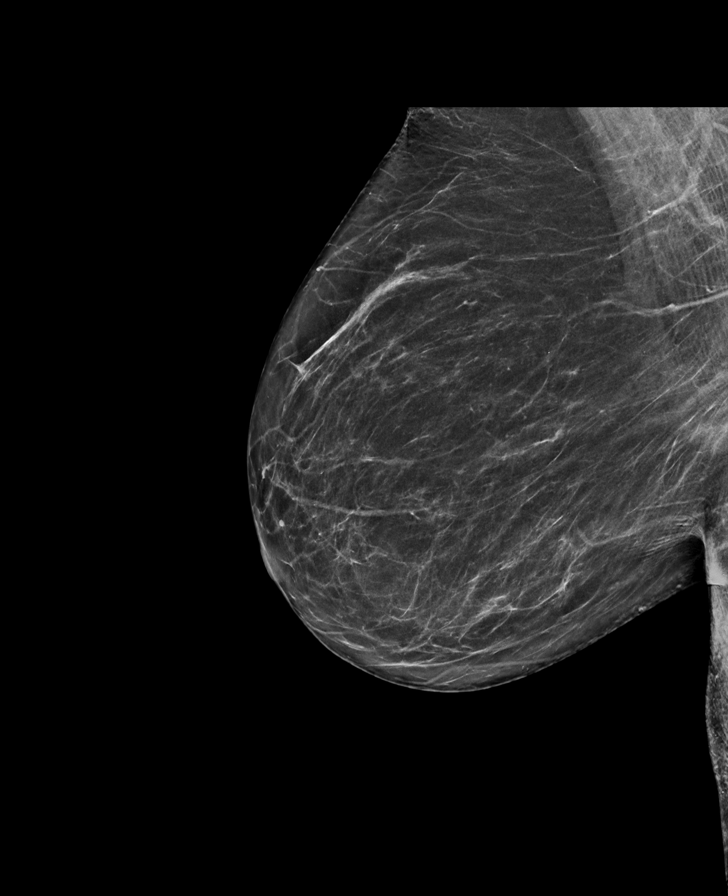

[R CC synth-2D]
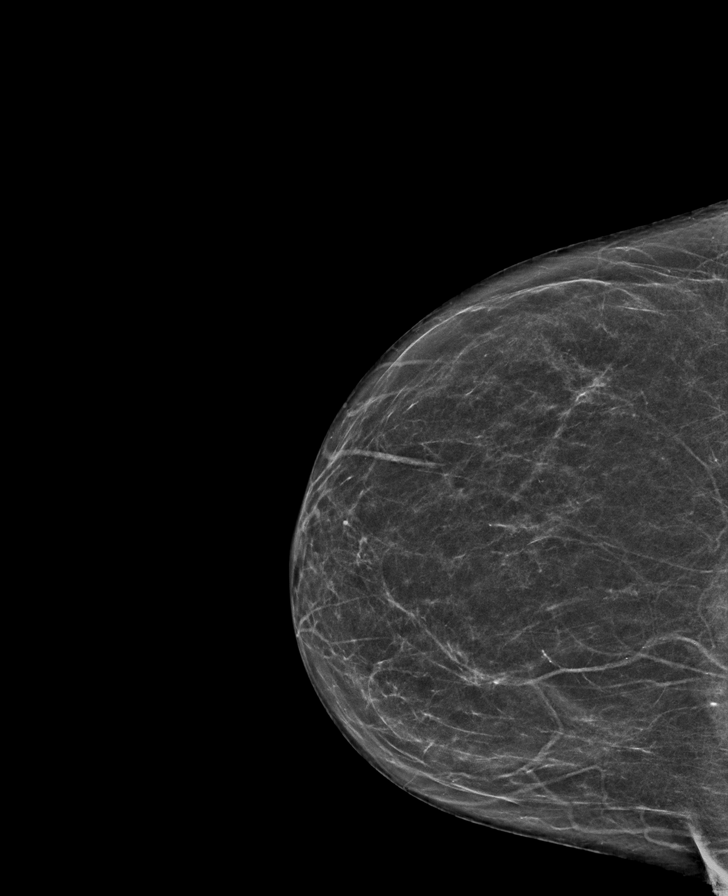

[L CC synth-2D]
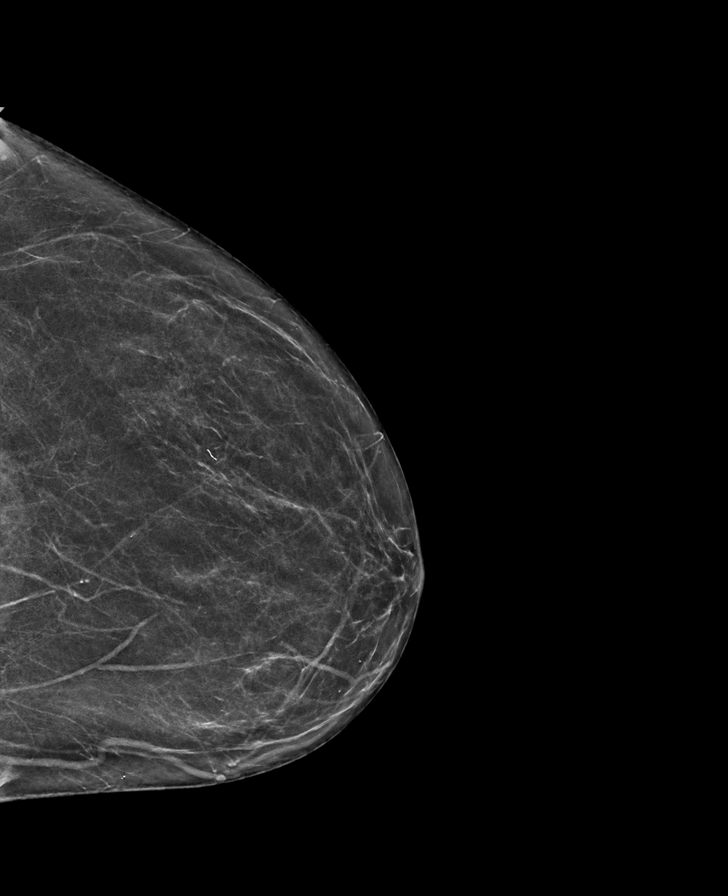

[R MLO tomo · tomo slice 37/73.0]
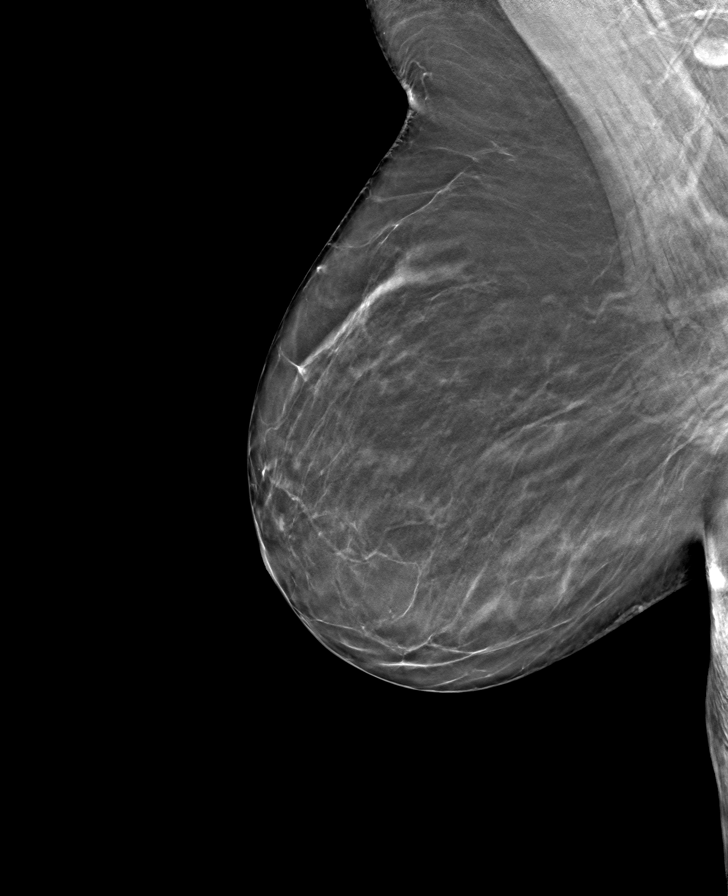

[R CC tomo · tomo slice 31/61.0]
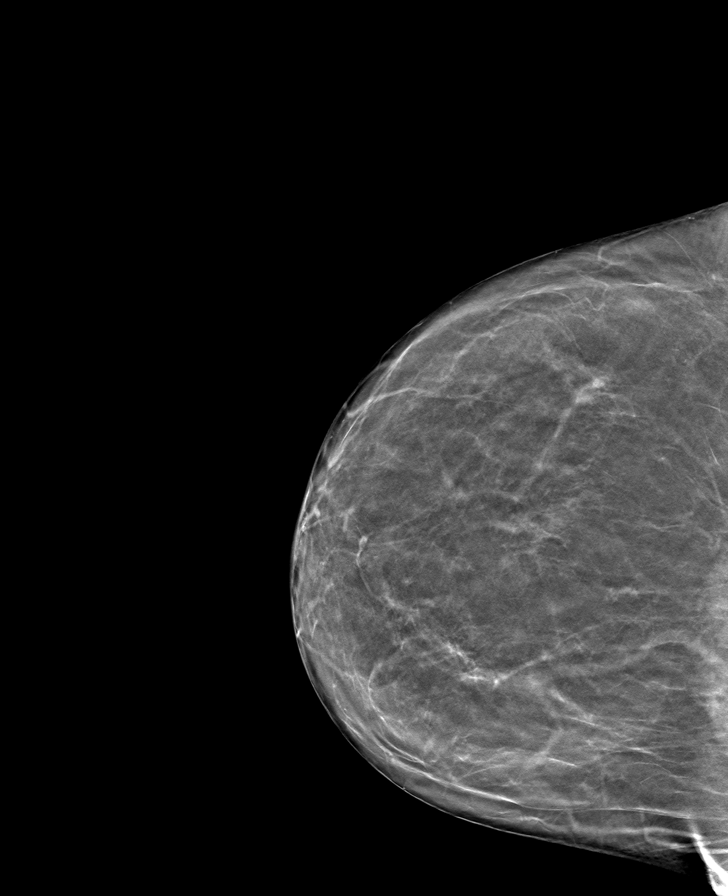

[L MLO tomo · tomo slice 37/72.0]
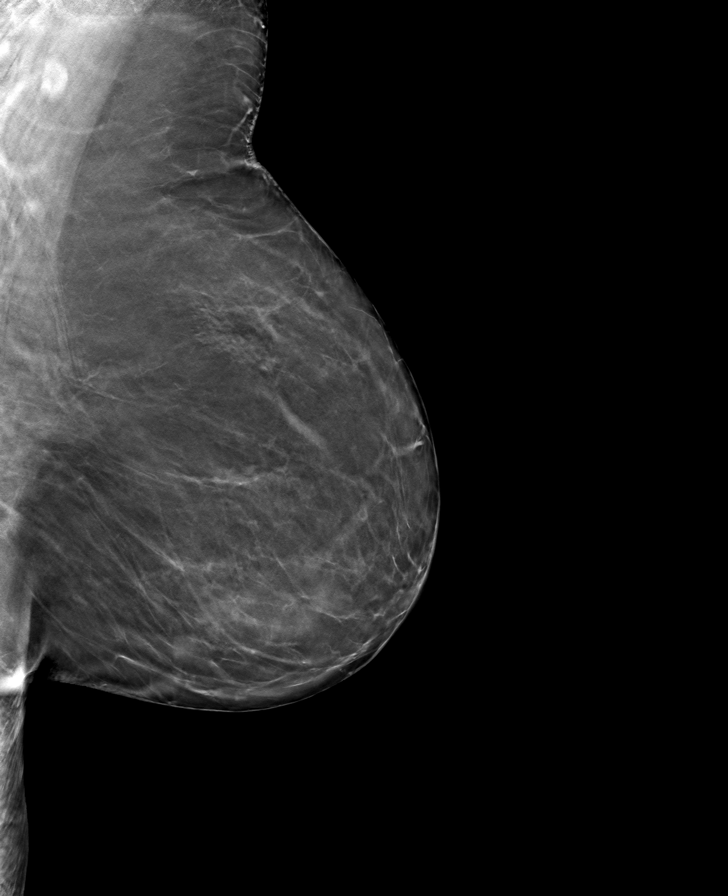

[L CC tomo · tomo slice 31/62.0]
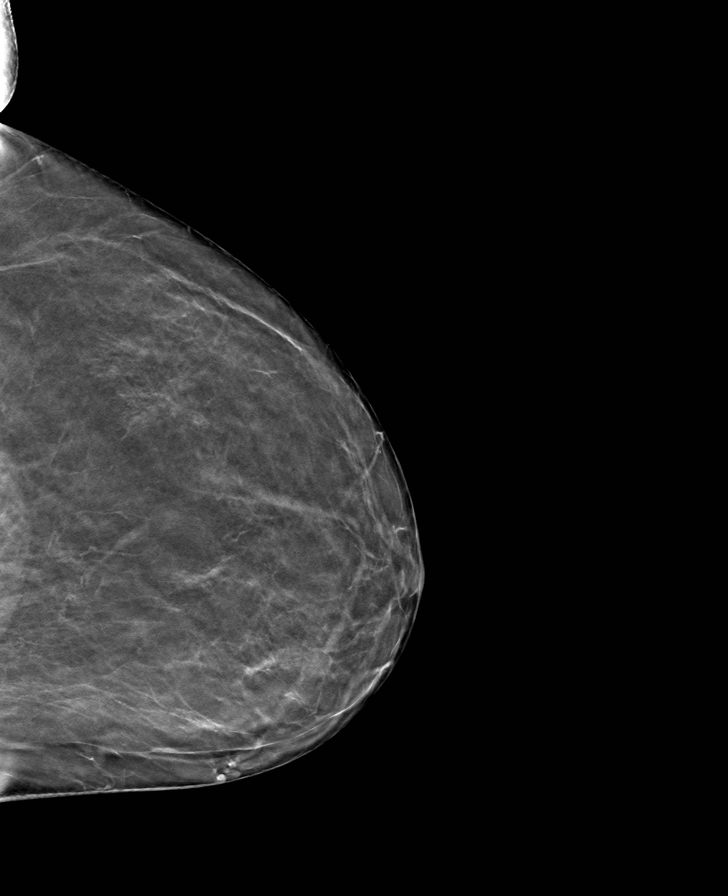

[8 of 24 positions shown; findings below may reference images not displayed]

ACR Breast Density Category b: There are scattered areas of
fibroglandular density.
FINDINGS: There are no findings suspicious for malignancy.
IMPRESSION: No mammographic evidence of malignancy. A result letter of this
screening mammogram will be mailed directly to the patient.

RECOMMENDATION:
Screening mammogram in one year. (Code:51-O-LD2)

BI-RADS CATEGORY  1: Negative.

## 2022-01-08 DIAGNOSIS — E039 Hypothyroidism, unspecified: Secondary | ICD-10-CM | POA: Diagnosis not present

## 2022-01-08 DIAGNOSIS — E119 Type 2 diabetes mellitus without complications: Secondary | ICD-10-CM | POA: Diagnosis not present

## 2022-01-14 DIAGNOSIS — E039 Hypothyroidism, unspecified: Secondary | ICD-10-CM | POA: Diagnosis not present

## 2022-01-14 DIAGNOSIS — E1165 Type 2 diabetes mellitus with hyperglycemia: Secondary | ICD-10-CM | POA: Diagnosis not present

## 2022-01-14 DIAGNOSIS — E042 Nontoxic multinodular goiter: Secondary | ICD-10-CM | POA: Diagnosis not present

## 2022-01-15 DIAGNOSIS — E1165 Type 2 diabetes mellitus with hyperglycemia: Secondary | ICD-10-CM | POA: Diagnosis not present

## 2022-02-18 DIAGNOSIS — E119 Type 2 diabetes mellitus without complications: Secondary | ICD-10-CM | POA: Diagnosis not present

## 2022-02-18 DIAGNOSIS — H353111 Nonexudative age-related macular degeneration, right eye, early dry stage: Secondary | ICD-10-CM | POA: Diagnosis not present

## 2022-02-18 DIAGNOSIS — H04123 Dry eye syndrome of bilateral lacrimal glands: Secondary | ICD-10-CM | POA: Diagnosis not present

## 2022-02-18 DIAGNOSIS — H2513 Age-related nuclear cataract, bilateral: Secondary | ICD-10-CM | POA: Diagnosis not present

## 2022-02-18 DIAGNOSIS — H02834 Dermatochalasis of left upper eyelid: Secondary | ICD-10-CM | POA: Diagnosis not present

## 2022-02-18 DIAGNOSIS — Q141 Congenital malformation of retina: Secondary | ICD-10-CM | POA: Diagnosis not present

## 2022-02-18 DIAGNOSIS — H02831 Dermatochalasis of right upper eyelid: Secondary | ICD-10-CM | POA: Diagnosis not present

## 2022-03-02 DIAGNOSIS — D225 Melanocytic nevi of trunk: Secondary | ICD-10-CM | POA: Diagnosis not present

## 2022-03-02 DIAGNOSIS — L821 Other seborrheic keratosis: Secondary | ICD-10-CM | POA: Diagnosis not present

## 2022-03-05 IMAGING — US US THYROID
1 series · 13 of 25 positions shown · non-contrast
Comparison: Prior thyroid ultrasound 02/02/2020

CLINICAL DATA: Prior ultrasound follow-up. Multinodular thyroid
gland. History of prior left hemithyroidectomy. TI-RADS category 3
isthmic nodule currently under imaging surveillance

EXAM:
THYROID ULTRASOUND
TECHNIQUE: Ultrasound examination of the thyroid gland and adjacent soft
tissues was performed.

[Series 1: us thyroid · 0.06mm/px · 13 of 65 slices shown]
[im 1/65]
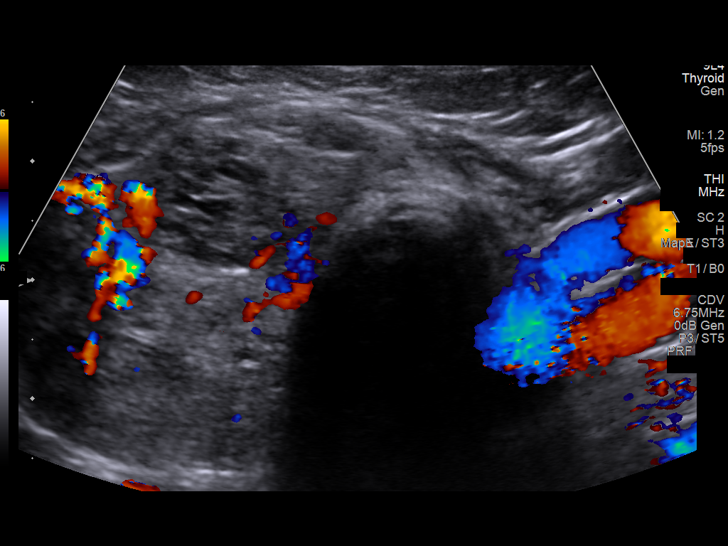
[im 6/65]
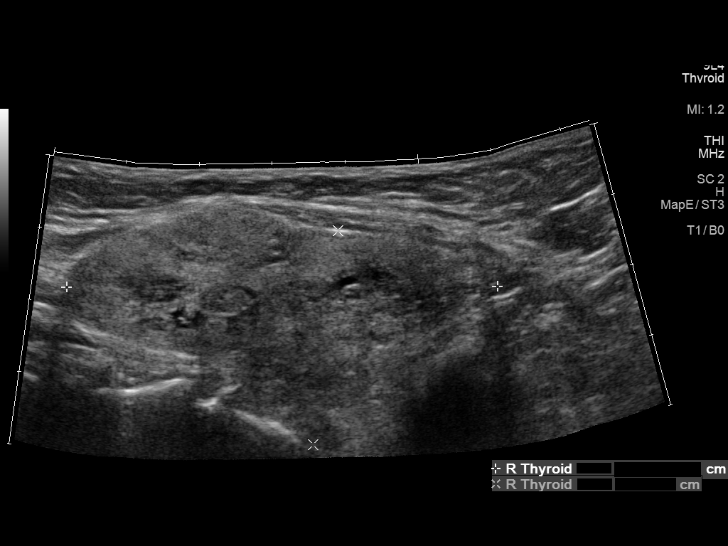
[im 11/65]
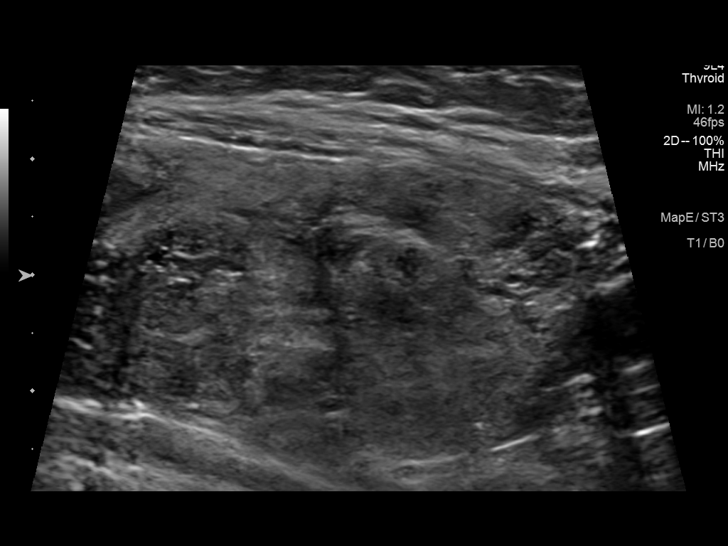
[im 17/65]
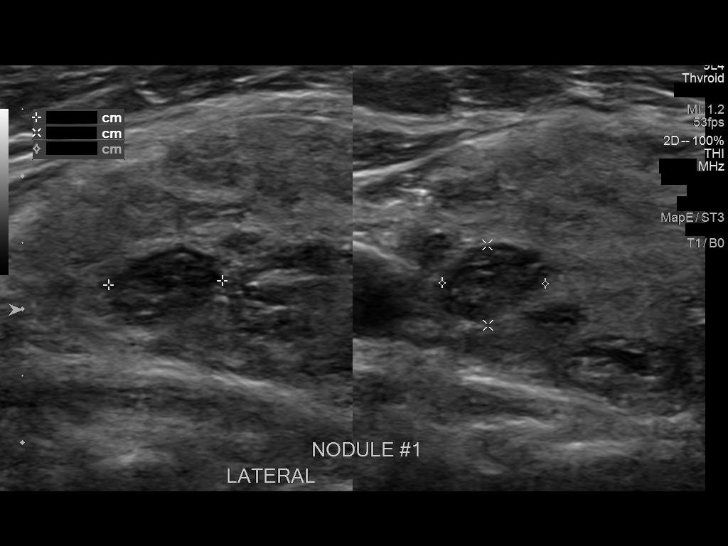
[im 22/65]
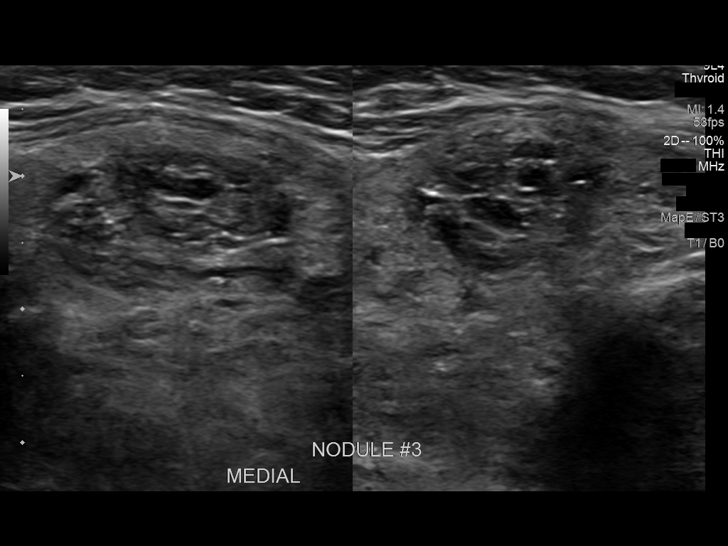
[im 27/65]
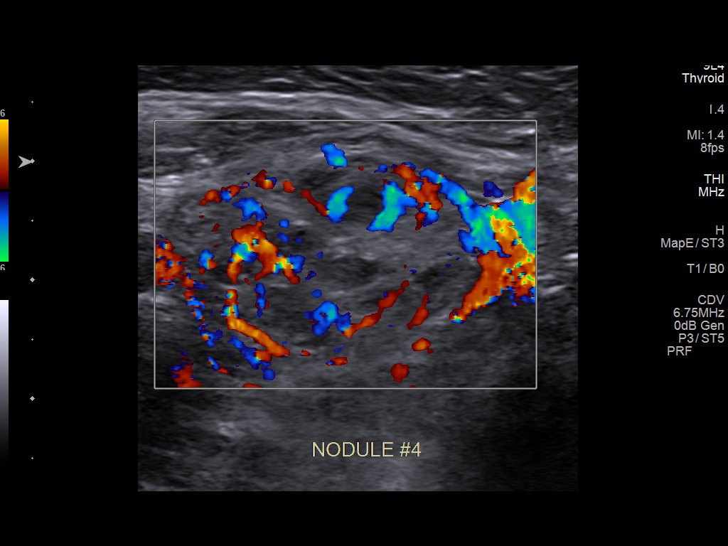
[im 33/65]
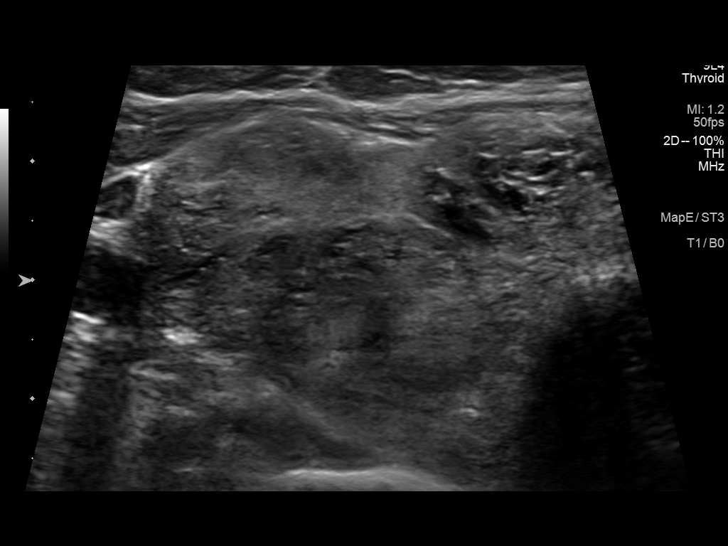
[im 38/65]
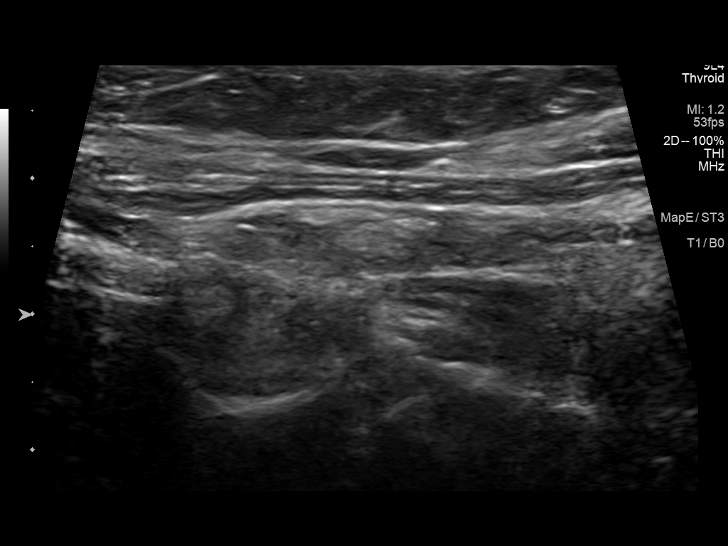
[im 43/65]
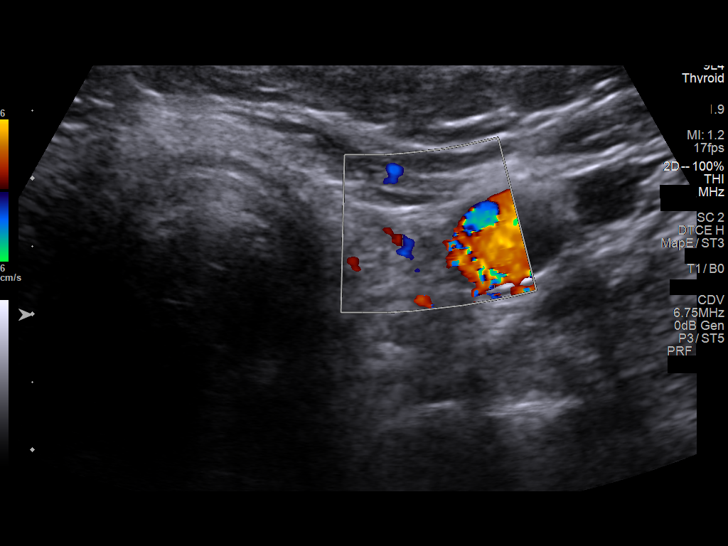
[im 49/65]
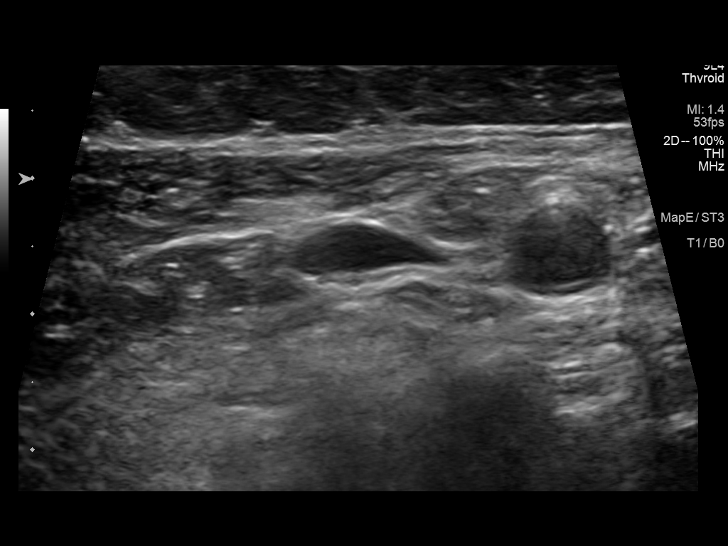
[im 54/65]
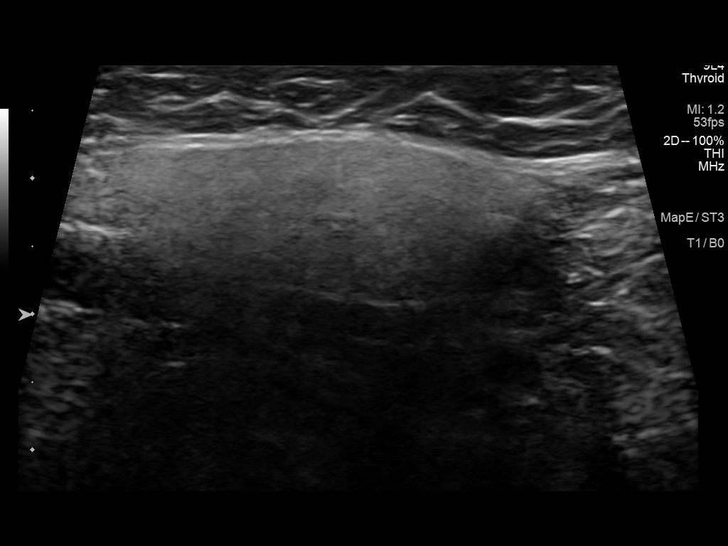
[im 59/65]
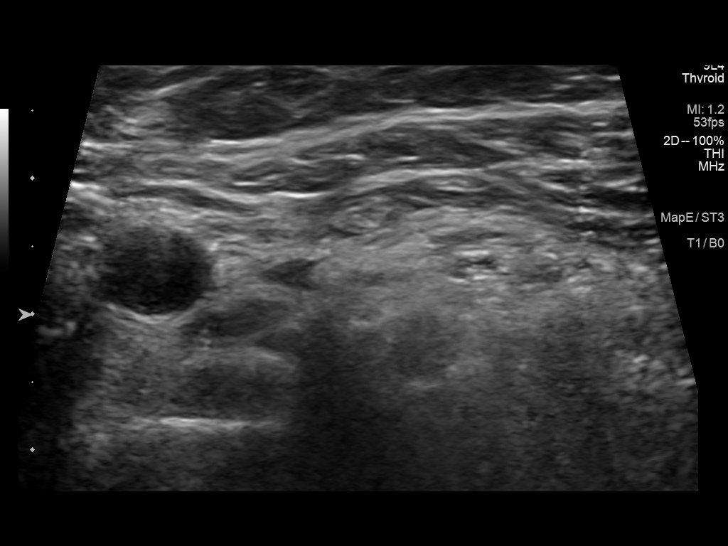
[im 65/65]
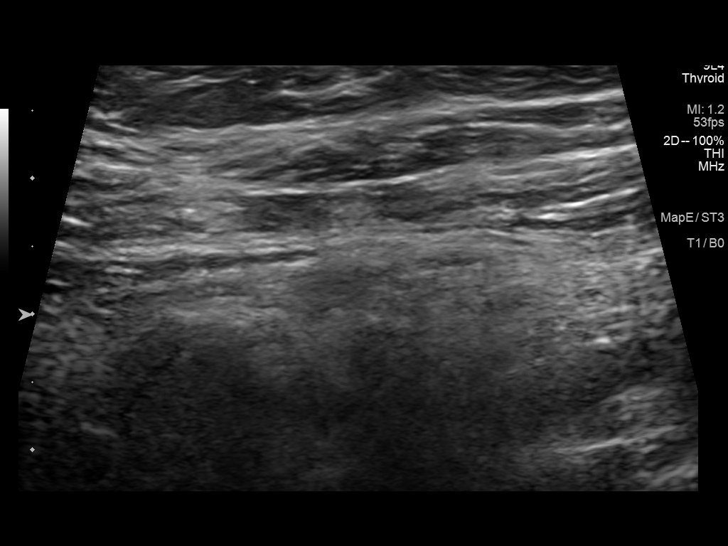

[13 of 25 positions shown; findings below may reference images not displayed]

FINDINGS: Parenchymal Echotexture: Moderately heterogenous

Isthmus: 0.3 cm

Right lobe: 5.9 x 3.0 x 3.2 cm

Left lobe: Surgically absent.

_________________________________________________________

Estimated total number of nodules >/= 1 cm: 3

Number of spongiform nodules >/=  2 cm not described below (TR1): 0

Number of mixed cystic and solid nodules >/= 1.5 cm not described
below (TR2): 0

_________________________________________________________

Nodule # 3: The previously identified isthmic nodule demonstrates
increased internal cystic degeneration on today's exam consistent
with spongiform morphology. This is now considered sonographically
low risk and no longer meets criteria for further evaluation.

Numerous additional small nodules present throughout the right
gland, all of which do not meet criteria for further evaluation.

No definitive residual thyroid tissue in the left resection bed.
IMPRESSION: 1. Surgical changes of prior right hemithyroidectomy.
2. Diffuse goitrous change with multiple nodules throughout the
remaining right gland and isthmus.
3. The previously identified isthmic nodule demonstrates increased
internal cystic degeneration and is now spongiform in appearance.
This is considered a benign morphology. This lesion no longer meets
criteria to recommend imaging surveillance.
4. No new nodules or suspicious features.

The above is in keeping with the ACR TI-RADS recommendations - [HOSPITAL] 9317;[DATE].

## 2022-03-18 ENCOUNTER — Ambulatory Visit: Payer: Medicare Other | Admitting: Dermatology

## 2022-04-16 DIAGNOSIS — E042 Nontoxic multinodular goiter: Secondary | ICD-10-CM | POA: Diagnosis not present

## 2022-04-16 DIAGNOSIS — E039 Hypothyroidism, unspecified: Secondary | ICD-10-CM | POA: Diagnosis not present

## 2022-04-16 DIAGNOSIS — E1165 Type 2 diabetes mellitus with hyperglycemia: Secondary | ICD-10-CM | POA: Diagnosis not present

## 2022-06-18 DIAGNOSIS — E890 Postprocedural hypothyroidism: Secondary | ICD-10-CM | POA: Diagnosis not present

## 2022-06-18 DIAGNOSIS — Z23 Encounter for immunization: Secondary | ICD-10-CM | POA: Diagnosis not present

## 2022-06-18 DIAGNOSIS — E1169 Type 2 diabetes mellitus with other specified complication: Secondary | ICD-10-CM | POA: Diagnosis not present

## 2022-06-18 DIAGNOSIS — E7800 Pure hypercholesterolemia, unspecified: Secondary | ICD-10-CM | POA: Diagnosis not present

## 2022-06-18 DIAGNOSIS — Z0000 Encounter for general adult medical examination without abnormal findings: Secondary | ICD-10-CM | POA: Diagnosis not present

## 2022-06-18 DIAGNOSIS — I10 Essential (primary) hypertension: Secondary | ICD-10-CM | POA: Diagnosis not present

## 2022-06-18 DIAGNOSIS — M8580 Other specified disorders of bone density and structure, unspecified site: Secondary | ICD-10-CM | POA: Diagnosis not present

## 2022-10-13 DIAGNOSIS — E1169 Type 2 diabetes mellitus with other specified complication: Secondary | ICD-10-CM | POA: Diagnosis not present

## 2022-10-21 DIAGNOSIS — R1013 Epigastric pain: Secondary | ICD-10-CM | POA: Diagnosis not present

## 2022-10-22 ENCOUNTER — Other Ambulatory Visit: Payer: Self-pay | Admitting: Family Medicine

## 2022-10-22 DIAGNOSIS — R1013 Epigastric pain: Secondary | ICD-10-CM

## 2022-11-11 ENCOUNTER — Ambulatory Visit
Admission: RE | Admit: 2022-11-11 | Discharge: 2022-11-11 | Disposition: A | Discharge status: Home / Self Care | Payer: Medicare Other | Source: Ambulatory Visit | Attending: Family Medicine | Admitting: Family Medicine

## 2022-11-11 DIAGNOSIS — R1011 Right upper quadrant pain: Secondary | ICD-10-CM | POA: Diagnosis not present

## 2022-11-11 DIAGNOSIS — R1013 Epigastric pain: Secondary | ICD-10-CM

## 2022-12-01 DIAGNOSIS — K219 Gastro-esophageal reflux disease without esophagitis: Secondary | ICD-10-CM | POA: Diagnosis not present

## 2022-12-01 DIAGNOSIS — R072 Precordial pain: Secondary | ICD-10-CM | POA: Diagnosis not present

## 2022-12-29 ENCOUNTER — Telehealth: Payer: Self-pay | Admitting: *Deleted

## 2022-12-29 NOTE — Patient Outreach (Signed)
  Care Coordination   Initial Visit Note   12/29/2022 Name: Karen Cordova MRN: [id number] DOB: Aug 05, 1950  Karen Cordova is a 72 y.o. year old female who sees Jarrett Soho, [place] for primary care. I spoke with  Hall Busing by phone today.  What matters to the patients health and wellness today?  Introduced pt to the Care Coordination program- pt declines at this time.   Goals Addressed   None     SDOH assessments and interventions completed:  No     Care Coordination Interventions:  No, not indicated   Follow up plan: No further intervention required.   Encounter Outcome:  Pt. Visit Completed

## 2023-01-15 ENCOUNTER — Other Ambulatory Visit: Payer: Self-pay | Admitting: Nurse Practitioner

## 2023-01-15 DIAGNOSIS — E1165 Type 2 diabetes mellitus with hyperglycemia: Secondary | ICD-10-CM | POA: Diagnosis not present

## 2023-01-15 DIAGNOSIS — E042 Nontoxic multinodular goiter: Secondary | ICD-10-CM | POA: Diagnosis not present

## 2023-01-21 ENCOUNTER — Ambulatory Visit
Admission: RE | Admit: 2023-01-21 | Discharge: 2023-01-21 | Disposition: A | Discharge status: Home / Self Care | Payer: Medicare Other | Source: Ambulatory Visit | Attending: Nurse Practitioner | Admitting: Nurse Practitioner

## 2023-01-21 DIAGNOSIS — E042 Nontoxic multinodular goiter: Secondary | ICD-10-CM | POA: Diagnosis not present

## 2023-01-21 DIAGNOSIS — E041 Nontoxic single thyroid nodule: Secondary | ICD-10-CM | POA: Diagnosis not present

## 2023-01-27 ENCOUNTER — Other Ambulatory Visit: Payer: Self-pay | Admitting: Nurse Practitioner

## 2023-01-27 DIAGNOSIS — E041 Nontoxic single thyroid nodule: Secondary | ICD-10-CM

## 2023-02-01 ENCOUNTER — Other Ambulatory Visit: Payer: Self-pay | Admitting: Family Medicine

## 2023-02-01 DIAGNOSIS — Z1231 Encounter for screening mammogram for malignant neoplasm of breast: Secondary | ICD-10-CM

## 2023-02-03 ENCOUNTER — Ambulatory Visit
Admission: RE | Admit: 2023-02-03 | Discharge: 2023-02-03 | Disposition: A | Discharge status: Home / Self Care | Payer: Medicare Other | Source: Ambulatory Visit | Attending: Family Medicine | Admitting: Family Medicine

## 2023-02-03 DIAGNOSIS — Z1231 Encounter for screening mammogram for malignant neoplasm of breast: Secondary | ICD-10-CM | POA: Diagnosis not present

## 2023-02-08 ENCOUNTER — Other Ambulatory Visit: Payer: Self-pay | Admitting: Family Medicine

## 2023-02-08 DIAGNOSIS — R928 Other abnormal and inconclusive findings on diagnostic imaging of breast: Secondary | ICD-10-CM

## 2023-02-11 ENCOUNTER — Ambulatory Visit: Payer: Medicare Other | Attending: Cardiology | Admitting: Cardiology

## 2023-02-11 ENCOUNTER — Encounter: Payer: Self-pay | Admitting: Cardiology

## 2023-02-11 VITALS — BP 140/82 | HR 77 | Ht 63.5 in | Wt 168.8 lb

## 2023-02-11 DIAGNOSIS — Z8249 Family history of ischemic heart disease and other diseases of the circulatory system: Secondary | ICD-10-CM | POA: Diagnosis not present

## 2023-02-11 DIAGNOSIS — Z01812 Encounter for preprocedural laboratory examination: Secondary | ICD-10-CM | POA: Diagnosis not present

## 2023-02-11 DIAGNOSIS — E7800 Pure hypercholesterolemia, unspecified: Secondary | ICD-10-CM | POA: Diagnosis not present

## 2023-02-11 DIAGNOSIS — R0602 Shortness of breath: Secondary | ICD-10-CM | POA: Diagnosis not present

## 2023-02-11 DIAGNOSIS — I10 Essential (primary) hypertension: Secondary | ICD-10-CM | POA: Diagnosis not present

## 2023-02-11 LAB — BASIC METABOLIC PANEL
BUN/Creatinine Ratio: 20 (ref 12–28)
BUN: 14 mg/dL (ref 8–27)
CO2: 26 mmol/L (ref 20–29)
Calcium: 9.3 mg/dL (ref 8.7–10.3)
Chloride: 104 mmol/L (ref 96–106)
Creatinine, Ser: 0.7 mg/dL (ref 0.57–1.00)
Glucose: 139 mg/dL — ABNORMAL HIGH (ref 70–99)
Potassium: 3.9 mmol/L (ref 3.5–5.2)
Sodium: 142 mmol/L (ref 134–144)
eGFR: 91 mL/min/{1.73_m2} (ref >59)

## 2023-02-11 MED ORDER — METOPROLOL TARTRATE 100 MG PO TABS: 100.0000 mg | ORAL_TABLET | Freq: Once | ORAL | 0 refills | Status: DC

## 2023-02-11 MED ORDER — PREDNISONE 50 MG PO TABS: ORAL_TABLET | ORAL | 0 refills | Status: DC

## 2023-02-11 MED ORDER — DIPHENHYDRAMINE HCL 50 MG PO CAPS: ORAL_CAPSULE | ORAL | 0 refills | Status: DC

## 2023-02-11 NOTE — Patient Instructions (Signed)
Medication Instructions:  Your physician recommends that you continue on your current medications as directed. Please refer to the Current Medication list given to you today.  *If you need a refill on your cardiac medications before your next appointment, please call your pharmacy*   Lab Work: Please complete BMET in our lab before you leave today.  If you have labs (blood work) drawn today and your tests are completely normal, you will receive your results only by: MyChart Message (if you have MyChart) OR A paper copy in the mail If you have any lab test that is abnormal or we need to change your treatment, we will call you to review the results.   Testing/Procedures: Your physician has requested that you have an echocardiogram. Echocardiography is a painless test that uses sound waves to create images of your heart. It provides your doctor with information about the size and shape of your heart and how well your heart's chambers and valves are working. This procedure takes approximately one hour. There are no restrictions for this procedure. Please do NOT wear cologne, perfume, aftershave, or lotions (deodorant is allowed). Please arrive 15 minutes prior to your appointment time.    Your cardiac CT will be scheduled at one of the below locations:   [facility] [address] [place], [place] [address] [phone]    Please arrive at the [facility] and [facility] Entrance (Entrance C2) of [facility] 30 minutes prior to test start time. You can use the FREE valet parking offered at entrance C (encouraged to control the heart rate for the test)  Proceed to the [facility] Radiology Department (first floor) to check-in and test prep.  All radiology patients and guests should use entrance C2 at [facility], accessed from [facility], even though the hospital's physical address listed is [address].      Please follow these  instructions carefully (unless otherwise directed):  An IV will be required for this test and Nitroglycerin will be given.  Hold all erectile dysfunction medications at least 3 days (72 hrs) prior to test. (Ie viagra, cialis, sildenafil, tadalafil, etc)   On the Night Before the Test: Be sure to Drink plenty of water. Do not consume any caffeinated/decaffeinated beverages or chocolate 12 hours prior to your test. Do not take any antihistamines 12 hours prior to your test. If the patient has contrast allergy: Patient will need a prescription for Prednisone and very clear instructions (as follows): Prednisone 50 mg - take 13 hours prior to test Take another Prednisone 50 mg 7 hours prior to test Take another Prednisone 50 mg 1 hour prior to test Take Benadryl 50 mg 1 hour prior to test Patient must complete all four doses of above prophylactic medications. Patient will need a ride after test due to Benadryl.  On the Day of the Test: Drink plenty of water until 1 hour prior to the test. Do not eat any food 1 hour prior to test. You may take your regular medications prior to the test.  Take metoprolol (Lopressor) two hours prior to test. If you take Furosemide/Hydrochlorothiazide/Spironolactone, please HOLD on the morning of the test. FEMALES- please wear underwire-free bra if available, avoid dresses & tight clothing        After the Test: Drink plenty of water. After receiving IV contrast, you may experience a mild flushed feeling. This is normal. On occasion, you may experience a mild rash up to 24 hours after  the test. This is not dangerous. If this occurs, you can take Benadryl 25 mg and increase your fluid intake. If you experience trouble breathing, this can be serious. If it is severe call 911 IMMEDIATELY. If it is mild, please call our office. If you take any of these medications: Glipizide/Metformin, Avandament, Glucavance, please do not take 48 hours after completing test  unless otherwise instructed.  We will call to schedule your test 2-4 weeks out understanding that some insurance companies will need an authorization prior to the service being performed.   For more information and frequently asked questions, please visit our website : [email or web address]  For non-scheduling related questions, please contact the cardiac imaging nurse navigator should you have any questions/concerns: Cardiac Imaging Nurse Navigators Direct Office Dial: [phone]   For scheduling needs, including cancellations and rescheduling, please call [place], [phone].    Follow-Up: At [facility], you and your health needs are our priority.  As part of our continuing mission to provide you with exceptional heart care, we have created designated Provider Care Teams.  These Care Teams include your primary Cardiologist (physician) and Advanced Practice Providers (APPs -  Physician Assistants and Nurse Practitioners) who all work together to provide you with the care you need, when you need it.  We recommend signing up for the patient portal called "MyChart".  Sign up information is provided on this After Visit Summary.  MyChart is used to connect with patients for Virtual Visits (Telemedicine).  Patients are able to view lab/test results, encounter notes, upcoming appointments, etc.  Non-urgent messages can be sent to your provider as well.   To learn more about what you can do with MyChart, go to [email or web address].    Your next appointment will be dependent on your results and it will be with:     Provider:   Dr. Armanda Magic, MD

## 2023-02-11 NOTE — Progress Notes (Signed)
Cardiology CONSULT Note    Date:  02/11/2023   ID:  Karen Cordova, DOB May 04, 1951, MRN [id number]  PCP:  Jarrett Soho, PA-C  Cardiologist:  Armanda Magic, MD   Chief Complaint  Patient presents with   New Patient (Initial Visit)    Chest pain    Patient Profile: Karen Cordova is a 72 y.o. female who is being seen today for the evaluation of SOB at the request of [name], MD.  History of Present Illness:  Karen Cordova is a 72 y.o. female who is being seen today for the evaluation of SOB at the request of [name], MD.  This is a 72yo female with a hx of HTN, asthma and GERD who is referred by Dr. Marca Ancona with GI for evaluation of SOB.   The patient is followed by [facility] for history of GERD. She was evaluated in May 24 for right upper quadrant abdominal pain with a gallstone and fatty liver.  She had complained of SOB intermittently for 9 months and describes it as a feeling she cannot take a deep breath.  It is constant and feels like she needs to yawn to take a deep breath.  It is nonexertional.  She is able to walk on the treadmill twice weekly and that does not make it worse. She does have a history of hypertension, diabetes mellitus, hyperlipidemia and a family history of congestive heart failure in her mother and father but no CAD.  Her younger brother had hypertensive heart disease and died at 79 but she is not sure if it was an MI.  She has never smoked.  She denies any chest pain or pressure, LE edema, dizziness, palpitations, PND or orthopnea or syncope.   Past Medical History:  Diagnosis Date   ALLERGIC RHINITIS 07/11/2007   ASTHMA    as a child, no problem as adult, no inhaler   DIVERTICULOSIS, COLON 07/06/2007   GERD 07/06/2007   HYPERTENSION 07/06/2007   HYPOTHYROIDISM 07/06/2007   Hypothyroidism    PARESTHESIA 02/13/2010   RESTLESS LEG SYNDROME 08/07/2008   Vitamin D deficiency     Past Surgical History:  Procedure Laterality Date   COLONOSCOPY      THYROIDECTOMY  1999   partial   TONSILLECTOMY AND ADENOIDECTOMY  1999   TUBAL LIGATION  1976   WISDOM TOOTH EXTRACTION      Current Medications: Current Meds  Medication Sig   atorvastatin (LIPITOR) 20 MG tablet Take 1 tablet (20 mg total) by mouth daily.   Calcium Carb-Cholecalciferol (CALCIUM + D3) 600-800 MG-UNIT TABS Take 1 tablet by mouth daily.   levothyroxine (SYNTHROID, LEVOTHROID) 50 MCG tablet TAKE 1 TABLET BY MOUTH  DAILY   lisinopril-hydrochlorothiazide (PRINZIDE,ZESTORETIC) 20-25 MG tablet TAKE 1 TABLET BY MOUTH  DAILY   metFORMIN (GLUCOPHAGE-XR) 500 MG 24 hr tablet TAKE 2 TABLETS BY MOUTH  DAILY WITH BREAKFAST   Multiple Vitamins-Minerals (PRESERVISION AREDS 2) CAPS Take 1 capsule by mouth daily.    Allergies:   Shrimp [shellfish allergy] and Codeine phosphate   Social History   Socioeconomic History   Marital status: Married    Spouse name: Not on file   Number of children: Not on file   Years of education: Not on file   Highest education level: Not on file  Occupational History   Not on file  Tobacco Use   Smoking status: Never   Smokeless tobacco: Never  Substance and Sexual Activity   Alcohol  use: No    Alcohol/week: 0.0 standard drinks of alcohol   Drug use: No   Sexual activity: Yes    Birth control/protection: Post-menopausal  Other Topics Concern   Not on file  Social History Narrative   Not on file   Social Determinants of Health   Financial Resource Strain: Not on file  Food Insecurity: Not on file  Transportation Needs: Not on file  Physical Activity: Not on file  Stress: Not on file  Social Connections: Not on file     Family History:  The patient's family history is not on file.   ROS:   Please see the history of present illness.    ROS All other systems reviewed and are negative.      No data to display             PHYSICAL EXAM:   VS:  BP (!) 140/82   Pulse 77   Ht 5' 3.5" (1.613 m)   Wt 168 lb 12.8 oz (76.6 kg)    SpO2 99%   BMI 29.43 kg/m    GEN: Well nourished, well developed, in no acute distress  HEENT: normal  Neck: no JVD, carotid bruits, or masses Cardiac: RRR; no murmurs, rubs, or gallops,no edema.  Intact distal pulses bilaterally.  Respiratory:  clear to auscultation bilaterally, normal work of breathing GI: soft, nontender, nondistended, + BS MS: no deformity or atrophy  Skin: warm and dry, no rash Neuro:  Alert and Oriented x 3, Strength and sensation are intact Psych: euthymic mood, full affect  Wt Readings from Last 3 Encounters:  02/11/23 168 lb 12.8 oz (76.6 kg)  12/30/16 180 lb 3.2 oz (81.7 kg)  07/08/16 179 lb 8 oz (81.4 kg)      Studies/Labs Reviewed:   EKG Interpretation Date/Time:  Thursday February 11 2023 08:51:04 EDT Ventricular Rate:  77 PR Interval:  152 QRS Duration:  78 QT Interval:  378 QTC Calculation: 427 R Axis:   -44  Text Interpretation: Normal sinus rhythm Left axis deviation Possible Anterior infarct , age undetermined No previous ECGs available Confirmed by Armanda Magic [phone]) on 02/11/2023 9:07:16 AM EKG Interpretation Date/Time:  Thursday February 11 2023 08:51:04 EDT Ventricular Rate:  77 PR Interval:  152 QRS Duration:  78 QT Interval:  378 QTC Calculation: 427 R Axis:   -44  Text Interpretation: Normal sinus rhythm Left axis deviation Possible Anterior infarct , age undetermined No previous ECGs available Confirmed by Armanda Magic ([id number]) on 02/11/2023 9:07:16 AM        Recent Labs: No results found for requested labs within last 365 days.   Lipid Panel    Component Value Date/Time   CHOL 213 (H) 12/23/2016 0802   TRIG 173.0 (H) 12/23/2016 0802   TRIG 143 06/28/2006 0857   HDL 46.00 12/23/2016 0802   CHOLHDL 5 12/23/2016 0802   VLDL 34.6 12/23/2016 0802   LDLCALC 132 (H) 12/23/2016 0802   LDLDIRECT 142.2 08/19/2009 1610    Additional studies/ records that were reviewed today include:  Office visit notes by Dr.  Marca Ancona    ASSESSMENT:    1. SOB (shortness of breath)   2. Essential hypertension   3. Pure hypercholesterolemia   4. Family history of CHF (congestive heart failure)      PLAN:  In order of problems listed above:  SOB -She has a history of GERD and gallstones followed by GI -Over the past year has been having intermittent SOB and has  multiple cardiac risk factors including hypertension, hyperlipidemia, diabetes mellitus and a family history of CHF. -her sx are atypical in that it is a constant feeling that she cannot take a breath.  She thinks it could be anxiety.  No CP -EKG is nonischemic -Recommend coronary CTA to define coronary anatomy and assess for coronary artery disease and also look at diaphragm to make sure no issues there that makes her feel like she cannot take a deep breath.   2.  Hypertension -BP is controlled on exam -Continue prescription drug management with lisinopril HCT 20/25 mg daily with PRN Refills  3.  Hyperlipidemia -LDL goal less than 100 unless she is found to have CAD and there would be less than 70 -I have personally reviewed and interpreted outside labs performed by patient's PCP which showed LDL 44 and HDL 44 on 06/18/2022 -Continue drug management with atorvastatin 20 mg daily with as needed refills  4.  Family hx of CHF -check 2D echo to rule out DCM  Time Spent: 20 minutes total time of encounter, including 15 minutes spent in face-to-face patient care on the date of this encounter. This time includes coordination of care and counseling regarding above mentioned problem list. Remainder of non-face-to-face time involved reviewing chart documents/testing relevant to the patient encounter and documentation in the medical record. I have independently reviewed documentation from referring provider  Followup:  PRN  Medication Adjustments/Labs and Tests Ordered: Current medicines are reviewed at length with the patient today.  Concerns regarding  medicines are outlined above.  Medication changes, Labs and Tests ordered today are listed in the Patient Instructions below.  There are no Patient Instructions on file for this visit.   Signed, Armanda Magic, MD  02/11/2023 9:09 AM    [facility] [address] [place], [place], [place]  [address] Phone: [phone]; Fax: [phone]

## 2023-02-11 NOTE — Addendum Note (Signed)
Addended by: Luellen Pucker on: 02/11/2023 09:17 AM   Modules accepted: Orders

## 2023-02-12 ENCOUNTER — Ambulatory Visit
Admission: RE | Admit: 2023-02-12 | Discharge: 2023-02-12 | Disposition: A | Discharge status: Home / Self Care | Payer: Medicare Other | Source: Ambulatory Visit | Attending: Family Medicine | Admitting: Family Medicine

## 2023-02-12 ENCOUNTER — Telehealth: Payer: Self-pay

## 2023-02-12 ENCOUNTER — Other Ambulatory Visit: Payer: Self-pay | Admitting: Family Medicine

## 2023-02-12 ENCOUNTER — Ambulatory Visit: Admission: RE | Admit: 2023-02-12 | Payer: Medicare Other | Source: Ambulatory Visit

## 2023-02-12 DIAGNOSIS — N6315 Unspecified lump in the right breast, overlapping quadrants: Secondary | ICD-10-CM | POA: Diagnosis not present

## 2023-02-12 DIAGNOSIS — R928 Other abnormal and inconclusive findings on diagnostic imaging of breast: Secondary | ICD-10-CM

## 2023-02-12 DIAGNOSIS — N6310 Unspecified lump in the right breast, unspecified quadrant: Secondary | ICD-10-CM

## 2023-02-12 NOTE — Telephone Encounter (Signed)
-----   Message from Armanda Magic sent at 02/11/2023  4:50 PM EDT ----- Please let patient know that labs were normal.  Continue current medical therapy.

## 2023-02-12 NOTE — Telephone Encounter (Signed)
Reviewed normal lab results with patient who verbalizes understanding and agrees to continue current medication therapy.

## 2023-02-18 ENCOUNTER — Other Ambulatory Visit (HOSPITAL_COMMUNITY): Payer: Medicare Other

## 2023-02-18 ENCOUNTER — Ambulatory Visit
Admission: RE | Admit: 2023-02-18 | Discharge: 2023-02-18 | Disposition: A | Discharge status: Home / Self Care | Payer: Medicare Other | Source: Ambulatory Visit | Attending: Family Medicine | Admitting: Family Medicine

## 2023-02-18 ENCOUNTER — Telehealth (HOSPITAL_COMMUNITY): Payer: Self-pay | Admitting: *Deleted

## 2023-02-18 DIAGNOSIS — N6310 Unspecified lump in the right breast, unspecified quadrant: Secondary | ICD-10-CM

## 2023-02-18 DIAGNOSIS — N6315 Unspecified lump in the right breast, overlapping quadrants: Secondary | ICD-10-CM | POA: Diagnosis not present

## 2023-02-18 DIAGNOSIS — N641 Fat necrosis of breast: Secondary | ICD-10-CM | POA: Diagnosis not present

## 2023-02-18 HISTORY — PX: BREAST BIOPSY: SHX20

## 2023-02-18 NOTE — Telephone Encounter (Signed)
Reaching out to patient to offer assistance regarding upcoming cardiac imaging study; pt verbalizes understanding of appt date/time, parking situation and where to check in, pre-test NPO status and medications ordered, and verified current allergies; name and call back number provided for further questions should they arise Hayley Sharpe RN Navigator Cardiac Imaging [facility] [phone] office [phone] cell  

## 2023-02-19 ENCOUNTER — Ambulatory Visit (HOSPITAL_COMMUNITY)
Admission: RE | Admit: 2023-02-19 | Discharge: 2023-02-19 | Disposition: A | Discharge status: Home / Self Care | Payer: Medicare Other | Source: Ambulatory Visit | Attending: Cardiology | Admitting: Cardiology

## 2023-02-19 ENCOUNTER — Encounter: Payer: Self-pay | Admitting: Cardiology

## 2023-02-19 DIAGNOSIS — R0602 Shortness of breath: Secondary | ICD-10-CM | POA: Diagnosis not present

## 2023-02-19 DIAGNOSIS — I2510 Atherosclerotic heart disease of native coronary artery without angina pectoris: Secondary | ICD-10-CM | POA: Diagnosis not present

## 2023-02-19 DIAGNOSIS — R079 Chest pain, unspecified: Secondary | ICD-10-CM | POA: Diagnosis not present

## 2023-02-19 MED ORDER — METOPROLOL TARTRATE 5 MG/5ML IV SOLN
INTRAVENOUS | Status: AC
  Filled 2023-02-19: qty 5

## 2023-02-19 MED ORDER — NITROGLYCERIN 0.4 MG SL SUBL
0.8000 mg | SUBLINGUAL_TABLET | Freq: Once | SUBLINGUAL | Status: AC
  Administered 2023-02-19: 0.8 mg via SUBLINGUAL

## 2023-02-19 MED ORDER — IOHEXOL 350 MG/ML SOLN
100.0000 mL | Freq: Once | INTRAVENOUS | Status: AC | PRN
  Administered 2023-02-19: 100 mL via INTRAVENOUS

## 2023-02-19 MED ORDER — NITROGLYCERIN 0.4 MG SL SUBL
SUBLINGUAL_TABLET | SUBLINGUAL | Status: AC
  Filled 2023-02-19: qty 2

## 2023-02-23 ENCOUNTER — Telehealth: Payer: Self-pay

## 2023-02-23 DIAGNOSIS — E7800 Pure hypercholesterolemia, unspecified: Secondary | ICD-10-CM

## 2023-02-23 MED ORDER — ASPIRIN 81 MG PO TBEC: 81.0000 mg | DELAYED_RELEASE_TABLET | Freq: Every day | ORAL | 12 refills | Status: AC

## 2023-02-23 NOTE — Telephone Encounter (Signed)
Reviewed with patient that Coronary CTA showed minimal plaque of less than 25% in the ostial left main, mid to distal LAD, proximal to mid left circumflex, OM1, proximal mid and distal RCA, PDA. Per Dr. Mayford Knife, this is all nonobstructive and would not be causing her chest pain. Patient agrees to start taking aspirin 81 mg daily, FLP and AL ordered and scheduled. 1 yr recall also placed.

## 2023-02-23 NOTE — Telephone Encounter (Signed)
-----   Message from Armanda Magic sent at 02/19/2023  2:29 PM EDT ----- Coronary CTA showed an elevated coronary calcium score 496 which is 88 percentile for age, sex and race matched controls.  She had minimal plaque of less than 25% in the ostial left main, mid to distal LAD, proximal to mid left circumflex, OM1, proximal mid and distal RCA, PDA.  This is all nonobstructive and would not be causing her chest pain.  Please have her take aspirin 81 mg daily and have her come in for FLP and ALT.  She needs to see me back in 1 year

## 2023-02-26 ENCOUNTER — Ambulatory Visit: Payer: Medicare Other | Attending: Cardiology

## 2023-02-26 ENCOUNTER — Ambulatory Visit: Payer: Medicare Other

## 2023-02-26 DIAGNOSIS — E785 Hyperlipidemia, unspecified: Secondary | ICD-10-CM | POA: Insufficient documentation

## 2023-02-26 DIAGNOSIS — R0602 Shortness of breath: Secondary | ICD-10-CM | POA: Diagnosis not present

## 2023-02-26 DIAGNOSIS — I5032 Chronic diastolic (congestive) heart failure: Secondary | ICD-10-CM

## 2023-02-26 DIAGNOSIS — E7800 Pure hypercholesterolemia, unspecified: Secondary | ICD-10-CM | POA: Insufficient documentation

## 2023-02-26 DIAGNOSIS — Z8249 Family history of ischemic heart disease and other diseases of the circulatory system: Secondary | ICD-10-CM | POA: Insufficient documentation

## 2023-02-26 LAB — ECHOCARDIOGRAM COMPLETE
Area-P 1/2: 3.19 cm2
S' Lateral: 3.1 cm

## 2023-02-27 LAB — LIPID PANEL
Chol/HDL Ratio: 3.3 ratio (ref 0.0–4.4)
Cholesterol, Total: 154 mg/dL (ref 100–199)
HDL: 46 mg/dL (ref >39)
LDL Chol Calc (NIH): 71 mg/dL (ref 0–99)
Triglycerides: 225 mg/dL — ABNORMAL HIGH (ref 0–149)
VLDL Cholesterol Cal: 37 mg/dL (ref 5–40)

## 2023-02-27 LAB — ALT: ALT: 19 IU/L (ref 0–32)

## 2023-03-01 ENCOUNTER — Encounter: Payer: Self-pay | Admitting: Cardiology

## 2023-03-01 ENCOUNTER — Telehealth: Payer: Self-pay

## 2023-03-01 DIAGNOSIS — I700 Atherosclerosis of aorta: Secondary | ICD-10-CM | POA: Insufficient documentation

## 2023-03-01 NOTE — Telephone Encounter (Signed)
-----   Message from Armanda Magic sent at 03/01/2023 11:14 AM EDT ----- Please find out this was a fasting lipid panel and if yes then I would like to add on Vascepa 2 g twice daily and repeat fasting lipid panel and ALT in 3 months

## 2023-03-01 NOTE — Telephone Encounter (Signed)
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  Please forward study for PCP to review

## 2023-03-01 NOTE — Telephone Encounter (Signed)
Call to patient to advise taht Coronary CTA showed an elevated coronary calcium score 496 which is 88 percentile for age, sex and race matched controls.  Minimal plaque of less than 25% in the ostial left main, mid to distal LAD, proximal to mid left circumflex, OM1, proximal mid and distal RCA, PDA.  Per Dr. Mayford Knife, this is all nonobstructive and would not be causing her chest pain.  Patient states she already takes an aspirin 81 mg daily. FLP and ALT completed 02/26/23. 1 yr recall in placed.

## 2023-03-01 NOTE — Telephone Encounter (Signed)
Called patient to explain 2D echo showed normal heart function EF 55 to 60% with increased stiffness of the heart called diastolic dysfunction. Per Dr. Mayford Knife, otherwise normal echo. Patient verbalizes understanding.

## 2023-03-01 NOTE — Telephone Encounter (Signed)
-----   Message from Armanda Magic sent at 03/01/2023 11:14 AM EDT ----- 2D echo showed normal heart function EF 55 to 60% with increased stiffness of the heart called diastolic dysfunction otherwise normal echo

## 2023-03-01 NOTE — Telephone Encounter (Signed)
Called patient to discuss lipid panel results. Patient states she was fasting for this lipid panel, agrees to start Vascepa 2 g BID. However, as I was entering order, alert came up showing that Vascepa may be contraindicated as patient has a shellfish allergy. Patient states she only had a "hives" reaction to eating shrimp a long time ago, but she has not had shrimp since that incident. I advised patient I would forward this information to Dr. Mayford Knife and Pharm D and would call her back to advise whether to move forward with this medication.

## 2023-03-01 NOTE — Telephone Encounter (Signed)
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  Patient verbalizes understanding, forwarded study for PCP to review.

## 2023-03-02 NOTE — Telephone Encounter (Signed)
There is a caution for patients who have seafood/shellfish allergies. I would see if she is able to eat other seafood? If she eats other fish without an issue it think it would ok to try if patient is willing to accept that there is a chance she could react to it.

## 2023-03-03 DIAGNOSIS — Z85828 Personal history of other malignant neoplasm of skin: Secondary | ICD-10-CM | POA: Diagnosis not present

## 2023-03-03 DIAGNOSIS — L570 Actinic keratosis: Secondary | ICD-10-CM | POA: Diagnosis not present

## 2023-03-03 DIAGNOSIS — L814 Other melanin hyperpigmentation: Secondary | ICD-10-CM | POA: Diagnosis not present

## 2023-03-03 DIAGNOSIS — D229 Melanocytic nevi, unspecified: Secondary | ICD-10-CM | POA: Diagnosis not present

## 2023-03-03 DIAGNOSIS — L821 Other seborrheic keratosis: Secondary | ICD-10-CM | POA: Diagnosis not present

## 2023-03-03 DIAGNOSIS — L578 Other skin changes due to chronic exposure to nonionizing radiation: Secondary | ICD-10-CM | POA: Diagnosis not present

## 2023-03-03 DIAGNOSIS — D1801 Hemangioma of skin and subcutaneous tissue: Secondary | ICD-10-CM | POA: Diagnosis not present

## 2023-03-04 DIAGNOSIS — E1165 Type 2 diabetes mellitus with hyperglycemia: Secondary | ICD-10-CM | POA: Diagnosis not present

## 2023-03-04 DIAGNOSIS — I10 Essential (primary) hypertension: Secondary | ICD-10-CM | POA: Diagnosis not present

## 2023-03-04 DIAGNOSIS — R1013 Epigastric pain: Secondary | ICD-10-CM | POA: Diagnosis not present

## 2023-03-04 DIAGNOSIS — E890 Postprocedural hypothyroidism: Secondary | ICD-10-CM | POA: Diagnosis not present

## 2023-03-04 DIAGNOSIS — E7800 Pure hypercholesterolemia, unspecified: Secondary | ICD-10-CM | POA: Diagnosis not present

## 2023-03-04 DIAGNOSIS — I700 Atherosclerosis of aorta: Secondary | ICD-10-CM | POA: Diagnosis not present

## 2023-03-04 NOTE — Telephone Encounter (Signed)
Called patient to further ascertain her exposure/response to seafood. Patient states she does eat fish once a week, which is usually salmon or flounder. She does not eat oysters, scallops, crab or shrimp. Responses forwarded to Pharm D and Dr. Mayford Knife.

## 2023-03-04 NOTE — Telephone Encounter (Signed)
It makes me a little more comfortable that she could tolerate, but there is still a chance she could be allergic. She would have to be willing to accept the risk. I would probably avoid using.

## 2023-03-05 NOTE — Telephone Encounter (Signed)
Would try for tricor (fenofibrate) 54mg  daily

## 2023-03-09 DIAGNOSIS — H179 Unspecified corneal scar and opacity: Secondary | ICD-10-CM | POA: Diagnosis not present

## 2023-03-09 DIAGNOSIS — H02834 Dermatochalasis of left upper eyelid: Secondary | ICD-10-CM | POA: Diagnosis not present

## 2023-03-09 DIAGNOSIS — H43823 Vitreomacular adhesion, bilateral: Secondary | ICD-10-CM | POA: Diagnosis not present

## 2023-03-09 DIAGNOSIS — E119 Type 2 diabetes mellitus without complications: Secondary | ICD-10-CM | POA: Diagnosis not present

## 2023-03-09 DIAGNOSIS — H2513 Age-related nuclear cataract, bilateral: Secondary | ICD-10-CM | POA: Diagnosis not present

## 2023-03-09 DIAGNOSIS — Q141 Congenital malformation of retina: Secondary | ICD-10-CM | POA: Diagnosis not present

## 2023-03-09 DIAGNOSIS — H04123 Dry eye syndrome of bilateral lacrimal glands: Secondary | ICD-10-CM | POA: Diagnosis not present

## 2023-03-09 DIAGNOSIS — H353111 Nonexudative age-related macular degeneration, right eye, early dry stage: Secondary | ICD-10-CM | POA: Diagnosis not present

## 2023-03-09 DIAGNOSIS — H02831 Dermatochalasis of right upper eyelid: Secondary | ICD-10-CM | POA: Diagnosis not present

## 2023-03-12 NOTE — Telephone Encounter (Signed)
Called to discuss Dr. Norris Cross recommendation to start tricor and then retest in 8 weeks.  No answer. LMTCB.

## 2023-03-19 ENCOUNTER — Ambulatory Visit
Admission: RE | Admit: 2023-03-19 | Discharge: 2023-03-19 | Disposition: A | Discharge status: Home / Self Care | Payer: Medicare Other | Source: Ambulatory Visit | Attending: Nurse Practitioner | Admitting: Nurse Practitioner

## 2023-03-19 ENCOUNTER — Other Ambulatory Visit (HOSPITAL_COMMUNITY)
Admission: RE | Admit: 2023-03-19 | Discharge: 2023-03-19 | Disposition: A | Discharge status: Home / Self Care | Payer: Medicare Other | Source: Ambulatory Visit | Attending: Nurse Practitioner | Admitting: Nurse Practitioner

## 2023-03-19 DIAGNOSIS — E041 Nontoxic single thyroid nodule: Secondary | ICD-10-CM | POA: Diagnosis not present

## 2023-03-19 DIAGNOSIS — E042 Nontoxic multinodular goiter: Secondary | ICD-10-CM | POA: Diagnosis not present

## 2023-03-22 ENCOUNTER — Telehealth: Payer: Self-pay

## 2023-03-22 DIAGNOSIS — Z79899 Other long term (current) drug therapy: Secondary | ICD-10-CM

## 2023-03-22 DIAGNOSIS — I25119 Atherosclerotic heart disease of native coronary artery with unspecified angina pectoris: Secondary | ICD-10-CM

## 2023-03-22 DIAGNOSIS — E785 Hyperlipidemia, unspecified: Secondary | ICD-10-CM

## 2023-03-22 LAB — CYTOLOGY - NON PAP

## 2023-03-22 MED ORDER — FENOFIBRATE 54 MG PO TABS
54.0000 mg | ORAL_TABLET | Freq: Every day | ORAL | 3 refills | Status: DC
Start: 2023-03-22 — End: 2024-04-12

## 2023-03-22 NOTE — Telephone Encounter (Signed)
Patient agrees to try tricor and restest lipids in 8 weeks. Labs scheduled for 05/25/23, tricor sent to pharmacy of choice.

## 2023-04-16 DIAGNOSIS — E1165 Type 2 diabetes mellitus with hyperglycemia: Secondary | ICD-10-CM | POA: Diagnosis not present

## 2023-04-16 DIAGNOSIS — E042 Nontoxic multinodular goiter: Secondary | ICD-10-CM | POA: Diagnosis not present

## 2023-04-23 DIAGNOSIS — E1165 Type 2 diabetes mellitus with hyperglycemia: Secondary | ICD-10-CM | POA: Diagnosis not present

## 2023-04-23 DIAGNOSIS — E039 Hypothyroidism, unspecified: Secondary | ICD-10-CM | POA: Diagnosis not present

## 2023-04-23 DIAGNOSIS — E785 Hyperlipidemia, unspecified: Secondary | ICD-10-CM | POA: Diagnosis not present

## 2023-04-23 DIAGNOSIS — E042 Nontoxic multinodular goiter: Secondary | ICD-10-CM | POA: Diagnosis not present

## 2023-05-10 DIAGNOSIS — Z23 Encounter for immunization: Secondary | ICD-10-CM | POA: Diagnosis not present

## 2023-05-20 DIAGNOSIS — Z9009 Acquired absence of other part of head and neck: Secondary | ICD-10-CM | POA: Diagnosis not present

## 2023-05-20 DIAGNOSIS — E042 Nontoxic multinodular goiter: Secondary | ICD-10-CM | POA: Diagnosis not present

## 2023-05-25 ENCOUNTER — Ambulatory Visit: Payer: Medicare Other | Attending: Cardiology

## 2023-05-25 DIAGNOSIS — E785 Hyperlipidemia, unspecified: Secondary | ICD-10-CM

## 2023-05-25 DIAGNOSIS — Z79899 Other long term (current) drug therapy: Secondary | ICD-10-CM | POA: Diagnosis not present

## 2023-05-26 ENCOUNTER — Telehealth: Payer: Self-pay

## 2023-05-26 ENCOUNTER — Other Ambulatory Visit: Payer: Medicare Other

## 2023-05-26 ENCOUNTER — Ambulatory Visit: Payer: Self-pay | Admitting: Surgery

## 2023-05-26 LAB — LIPID PANEL
Chol/HDL Ratio: 2.9 ratio (ref 0.0–4.4)
Cholesterol, Total: 123 mg/dL (ref 100–199)
HDL: 43 mg/dL (ref >39)
LDL Chol Calc (NIH): 54 mg/dL (ref 0–99)
Triglycerides: 155 mg/dL — ABNORMAL HIGH (ref 0–149)
VLDL Cholesterol Cal: 26 mg/dL (ref 5–40)

## 2023-05-26 LAB — ALT: ALT: 15 [IU]/L (ref 0–32)

## 2023-05-26 NOTE — Telephone Encounter (Signed)
Call to patient to advise that cholesterol labs are normal and Dr. Mayford Knife recommends continuing with current medications. Patient verbalizes understanding, labs forwarded to PCP.

## 2023-05-26 NOTE — Telephone Encounter (Signed)
-----   Message from Armanda Magic sent at 05/26/2023  9:15 AM EST ----- Stable labs - continue current meds and forward to PCP

## 2023-06-14 NOTE — Progress Notes (Signed)
COVID Vaccine received:  [x]  No []  Yes Date of any COVID positive Test in last 90 days:  None  PCP - Jarrett Soho, PA-C [phone] (Work) [phone] (Fax)  Cardiologist - Armanda Magic, MD   Endocrinology- Dennie Maizes, NP at [facility] [phone]  Chest x-ray - 2014  2v   Epic EKG - 02-11-2023  Stress Test -  ECHO - 02-26-2023  ECHO Cardiac Cath -  CT Coronary Calcium score 496 on 02-19-2023  PCR screen: []  Ordered & Completed []   No Order but Needs PROFEND     [x]   N/A for this surgery  Surgery Plan:  []  Ambulatory   [x]  Outpatient in bed  []  Admit Anesthesia:    [x]  General  []  Spinal  []   Choice []   MAC  Pacemaker / ICD device [x]  No []  Yes   Spinal Cord Stimulator:[x]  No []  Yes       History of Sleep Apnea? [x]  No []  Yes   CPAP used?- [x]  No []  Yes    Does the patient monitor blood sugar?   []  N/A   []  No [x]  Yes  Patient has: []  NO Hx DM   [x]  Pre-DM   []  DM1  []   DM2 Last A1c was: 5.8  on  07-08-2016      Does patient have a [employer] or Dexacom? [x]  No []  Yes   Fasting Blood Sugar Ranges- 130-165 Checks Blood Sugar __1 times a day  Diabetic medications/ instructions: Metformin 1000mg  bid ac        Hold DOS  Blood Thinner / Instructions:None Aspirin Instructions:  ASA 81 mg   stop 1 week prior  ERAS Protocol Ordered: []  No  [x]  Yes PRE-SURGERY []  ENSURE  []  G2   [x]  No Drink Ordered Patient is to be NPO after: 0700  Dental hx: []  Dentures:  []  N/A      [x]  Bridge or Partial: permanent bridge on bottom                  []  Loose or Damaged teeth:   Comments:   Activity level: Patient is able to climb a flight of stairs without difficulty; [x]  No CP  [x]  No SOB.  Patient can perform ADLs without assistance.   Anesthesia review: CAD (calcium score 496) Pre-DM, HTN, RLS, asthma as a child, GERD, fatty liver   Patient denies shortness of breath, fever, cough and chest pain at PAT appointment.  Patient verbalized understanding and  agreement to the Pre-Surgical Instructions that were given to them at this PAT appointment. Patient was also educated of the need to review these PAT instructions again prior to her surgery.I reviewed the appropriate phone numbers to call if they have any and questions or concerns.

## 2023-06-14 NOTE — Patient Instructions (Signed)
SURGICAL WAITING ROOM VISITATION Patients having surgery or a procedure may have no more than 2 support people in the waiting area - these visitors may rotate in the visitor waiting room.   Due to an increase in RSV and influenza rates and associated hospitalizations, children ages 8 and under may not visit patients in [facility] hospitals. If the patient needs to stay at the hospital during part of their recovery, the visitor guidelines for inpatient rooms apply.  PRE-OP VISITATION  Pre-op nurse will coordinate an appropriate time for 1 support person to accompany the patient in pre-op.  This support person may not rotate.  This visitor will be contacted when the time is appropriate for the visitor to come back in the pre-op area.  Please refer to the [facility] website for the visitor guidelines for Inpatients (after your surgery is over and you are in a regular room).  You are not required to quarantine at this time prior to your surgery. However, you must do this: Hand Hygiene often Do NOT share personal items Notify your provider if you are in close contact with someone who has COVID or you develop fever 100.4 or greater, new onset of sneezing, cough, sore throat, shortness of breath or body aches.  If you test positive for Covid or have been in contact with anyone that has tested positive in the last 10 days please notify you surgeon.    Your procedure is scheduled on:  Monday  June 21, 2023  Report to [facility] Main Entrance: [place] entrance where the [employer] is available.   Report to admitting at: 07:45    AM  Call this number if you have any questions or problems the morning of surgery [phone]  Do not eat food after Midnight the night prior to your surgery/procedure.  STOP TAKING ASPIRIN 1 weeks before your surgery.   After Midnight you may have the following liquids until    07:00 AM DAY OF SURGERY  Clear Liquid Diet Water Black Coffee (sugar  ok, NO MILK/CREAM OR CREAMERS)  Tea (sugar ok, NO MILK/CREAM OR CREAMERS) regular and decaf                             Plain Jell-O  with no fruit (NO RED)                                           Fruit ices (not with fruit pulp, NO RED)                                     Popsicles (NO RED)                                                                  Juice: NO CITRUS JUICES: only apple, WHITE grape, WHITE cranberry Sports drinks like Gatorade or Powerade (NO RED)               FOLLOW ANY ADDITIONAL PRE OP INSTRUCTIONS YOU RECEIVED FROM YOUR SURGEON'S OFFICE!!!  Oral Hygiene is also important to reduce your risk of infection.        Remember - BRUSH YOUR TEETH THE MORNING OF SURGERY WITH YOUR REGULAR TOOTHPASTE  Do NOT smoke after Midnight the night before surgery.  STOP TAKING all Vitamins, Herbs and supplements 1 week before your surgery.   Take ONLY these medicines the morning of surgery with A SIP OF WATER: Levothyroxine,   You may use your Eye drops if needed for dry eyes.                     You may not have any metal on your body including hair pins, jewelry, and body piercing  Do not wear make-up, lotions, powders, perfumes  or deodorant  Do not wear nail polish including gel and S&S, artificial / acrylic nails, or any other type of covering on natural nails including finger and toenails. If you have artificial nails, gel coating, etc., that needs to be removed by a nail salon, Please have this removed prior to surgery. Not doing so may mean that your surgery could be cancelled or delayed if the Surgeon or anesthesia staff feels like they are unable to monitor you safely.   Do not shave 48 hours prior to surgery to avoid nicks in your skin which may contribute to postoperative infections.   Contacts, Hearing Aids, dentures or bridgework may not be worn into surgery. DENTURES WILL BE REMOVED PRIOR TO SURGERY PLEASE DO NOT APPLY "Poly grip" OR ADHESIVES!!!  You may bring  a small overnight bag with you on the day of surgery, only pack items that are not valuable. [place] IS NOT RESPONSIBLE   FOR VALUABLES THAT ARE LOST OR STOLEN.   Do not bring your home medications to the hospital. The Pharmacy will dispense medications listed on your medication list to you during your admission in the Hospital.  Special Instructions: Bring a copy of your healthcare power of attorney and living will documents the day of surgery, if you wish to have them scanned into your [facility] Records- EPIC  Please read over the following fact sheets you were given: IF YOU HAVE QUESTIONS ABOUT YOUR PRE-OP INSTRUCTIONS, PLEASE CALL [phone]   [facility] - Preparing for Surgery Before surgery, you can play an important role.  Because skin is not sterile, your skin needs to be as free of germs as possible.  You can reduce the number of germs on your skin by washing with CHG (chlorahexidine gluconate) soap before surgery.  CHG is an antiseptic cleaner which kills germs and bonds with the skin to continue killing germs even after washing. Please DO NOT use if you have an allergy to CHG or antibacterial soaps.  If your skin becomes reddened/irritated stop using the CHG and inform your nurse when you arrive at Short Stay. Do not shave (including legs and underarms) for at least 48 hours prior to the first CHG shower.  You may shave your face/neck.  Please follow these instructions carefully:  1.  Shower with CHG Soap the night before surgery and the  morning of surgery.  2.  If you choose to wash your hair, wash your hair first as usual with your normal  shampoo.  3.  After you shampoo, rinse your hair and body thoroughly to remove the shampoo.  4.  Use CHG as you would any other liquid soap.  You can apply chg directly to the skin and wash.  Gently with a scrungie or clean washcloth.  5.  Apply the CHG Soap to your body ONLY FROM THE NECK DOWN.   Do not  use on face/ open                           Wound or open sores. Avoid contact with eyes, ears mouth and genitals (private parts).                       Wash face,  Genitals (private parts) with your normal soap.             6.  Wash thoroughly, paying special attention to the area where your  surgery  will be performed.  7.  Thoroughly rinse your body with warm water from the neck down.  8.  DO NOT shower/wash with your normal soap after using and rinsing off the CHG Soap.            9.  Pat yourself dry with a clean towel.            10.  Wear clean pajamas.            11.  Place clean sheets on your bed the night of your first shower and do not  sleep with pets.  ON THE DAY OF SURGERY : Do not apply any lotions/deodorants the morning of surgery.  Please wear clean clothes to the hospital/surgery center.     FAILURE TO FOLLOW THESE INSTRUCTIONS MAY RESULT IN THE CANCELLATION OF YOUR SURGERY  PATIENT SIGNATURE_________________________________  NURSE SIGNATURE__________________________________  ________________________________________________________________________

## 2023-06-15 ENCOUNTER — Other Ambulatory Visit: Payer: Self-pay

## 2023-06-15 ENCOUNTER — Encounter (HOSPITAL_COMMUNITY): Payer: Self-pay

## 2023-06-15 ENCOUNTER — Encounter (HOSPITAL_COMMUNITY)
Admission: RE | Admit: 2023-06-15 | Discharge: 2023-06-15 | Disposition: A | Discharge status: Home / Self Care | Payer: Medicare Other | Source: Ambulatory Visit | Attending: Surgery | Admitting: Surgery

## 2023-06-15 VITALS — BP 144/69 | HR 72 | Temp 98.2°F | Resp 16 | Ht 62.5 in | Wt 162.0 lb

## 2023-06-15 DIAGNOSIS — E042 Nontoxic multinodular goiter: Secondary | ICD-10-CM | POA: Insufficient documentation

## 2023-06-15 DIAGNOSIS — I10 Essential (primary) hypertension: Secondary | ICD-10-CM | POA: Diagnosis not present

## 2023-06-15 DIAGNOSIS — Z01812 Encounter for preprocedural laboratory examination: Secondary | ICD-10-CM | POA: Diagnosis not present

## 2023-06-15 DIAGNOSIS — J45909 Unspecified asthma, uncomplicated: Secondary | ICD-10-CM | POA: Insufficient documentation

## 2023-06-15 DIAGNOSIS — I2510 Atherosclerotic heart disease of native coronary artery without angina pectoris: Secondary | ICD-10-CM | POA: Diagnosis not present

## 2023-06-15 DIAGNOSIS — Z01818 Encounter for other preprocedural examination: Secondary | ICD-10-CM

## 2023-06-15 DIAGNOSIS — K760 Fatty (change of) liver, not elsewhere classified: Secondary | ICD-10-CM | POA: Insufficient documentation

## 2023-06-15 HISTORY — DX: Nausea with vomiting, unspecified: Z98.890

## 2023-06-15 HISTORY — DX: Prediabetes: R73.03

## 2023-06-15 HISTORY — DX: Malignant (primary) neoplasm, unspecified: C80.1

## 2023-06-15 HISTORY — DX: Myoneural disorder, unspecified: G70.9

## 2023-06-15 LAB — COMPREHENSIVE METABOLIC PANEL
ALT: 21 U/L (ref 0–44)
AST: 26 U/L (ref 15–41)
Albumin: 4.3 g/dL (ref 3.5–5.0)
Alkaline Phosphatase: 88 U/L (ref 38–126)
Anion gap: 9 (ref 5–15)
BUN: 22 mg/dL (ref 8–23)
CO2: 27 mmol/L (ref 22–32)
Calcium: 9.6 mg/dL (ref 8.9–10.3)
Chloride: 104 mmol/L (ref 98–111)
Creatinine, Ser: 0.76 mg/dL (ref 0.44–1.00)
GFR, Estimated: >60 mL/min (ref >60)
Glucose, Bld: 126 mg/dL — ABNORMAL HIGH (ref 70–99)
Potassium: 4.1 mmol/L (ref 3.5–5.1)
Sodium: 140 mmol/L (ref 135–145)
Total Bilirubin: 0.7 mg/dL (ref <1.2)
Total Protein: 7.6 g/dL (ref 6.5–8.1)

## 2023-06-15 LAB — CBC
HCT: 40.1 % (ref 36.0–46.0)
Hemoglobin: 13.2 g/dL (ref 12.0–15.0)
MCH: 30.4 pg (ref 26.0–34.0)
MCHC: 32.9 g/dL (ref 30.0–36.0)
MCV: 92.4 fL (ref 80.0–100.0)
Platelets: 197 10*3/uL (ref 150–400)
RBC: 4.34 MIL/uL (ref 3.87–5.11)
RDW: 11.9 % (ref 11.5–15.5)
WBC: 4.4 10*3/uL (ref 4.0–10.5)
nRBC: 0 % (ref 0.0–0.2)

## 2023-06-16 NOTE — Progress Notes (Signed)
Anesthesia Chart Review   Case: [id number] Date/Time: 06/21/23 0945   Procedure: COMPLETION OF THYROIDECTOMY   Anesthesia type: General   Pre-op diagnosis: MULTPLE THYROID NODULES   Location: [facility] ROOM 01 / [facility]   Surgeons: Darnell Level, MD       DISCUSSION:72 y.o. never smoker with h/o PONV, HTN, asthma, CAD, multiple thyroid nodules scheduled for above procedure 06/21/2023 with Dr. Darnell Level.   Pt seen by cardiology 02/11/2023 for evaluation of shortness of breath. EKG with no concerning findings.  Echo ordered.    Echo 02/26/2023 with EF 55-60%, no valvular problems.  Pt advised to follow up with cardio prn.   VS: BP (!) 144/69 Comment: right arm sitting  Pulse 72   Temp 36.8 C (Oral)   Resp 16   Ht 5' 2.5" (1.588 m)   Wt 73.5 kg   SpO2 98%   BMI 29.16 kg/m   PROVIDERS: Jarrett Soho, PA-C is PCP   Cardiologist - Armanda Magic, MD    LABS: Labs reviewed: Acceptable for surgery. (all labs ordered are listed, but only abnormal results are displayed)  Labs Reviewed  COMPREHENSIVE METABOLIC PANEL - Abnormal; Notable for the following components:      Result Value   Glucose, Bld 126 (*)    All other components within normal limits  CBC     IMAGES:   EKG:   CV: Echo 02/26/2023  1. Left ventricular ejection fraction, by estimation, is 55 to 60%. The  left ventricle has normal function. The left ventricle has no regional  wall motion abnormalities. Left ventricular diastolic parameters are  consistent with Grade I diastolic  dysfunction (impaired relaxation).   2. Right ventricular systolic function is normal. The right ventricular  size is normal. Tricuspid regurgitation signal is inadequate for assessing  PA pressure.   3. The mitral valve is normal in structure. Trivial mitral valve  regurgitation. No evidence of mitral stenosis.   4. The aortic valve is tricuspid. Aortic valve regurgitation is not  visualized. No aortic stenosis is present.   5. The  inferior vena cava is normal in size with greater than 50%  respiratory variability, suggesting right atrial pressure of 3 mmHg.  Past Medical History:  Diagnosis Date   ALLERGIC RHINITIS 07/11/2007   Aortic atherosclerosis (HCC)    ASTHMA    as a child, no problem as adult, no inhaler   CAD (coronary artery disease), native coronary artery    coronary calcium score 496 which is 39 percentile for age, sex and race matched controls.  She had minimal plaque of less than 25% in the ostial left main, mid to distal LAD, proximal to mid left circumflex, OM1, proximal mid and distal RCA, PDA. 02/2023   Cancer (HCC)    lesion on nose   DIVERTICULOSIS, COLON 07/06/2007   GERD 07/06/2007   HLD (hyperlipidemia)    HYPERTENSION 07/06/2007   HYPOTHYROIDISM 07/06/2007   Neuromuscular disorder (HCC)    Restless legs syndrome and crampin   PARESTHESIA 02/13/2010   PONV (postoperative nausea and vomiting)    Pre-diabetes    RESTLESS LEG SYNDROME 08/07/2008   Vitamin D deficiency     Past Surgical History:  Procedure Laterality Date   BREAST BIOPSY Right 02/18/2023   [place] RT BREAST BX W LOC DEV 1ST LESION IMG BX SPEC US GUIDE 02/18/2023 [facility]   COLONOSCOPY     THYROIDECTOMY  1999   partial   TONSILLECTOMY AND ADENOIDECTOMY  1999   TUBAL  LIGATION  1976   WISDOM TOOTH EXTRACTION      MEDICATIONS:  aspirin EC 81 MG tablet   atorvastatin (LIPITOR) 20 MG tablet   Biotin 5000 MCG TABS   Calcium Carb-Cholecalciferol (CALCIUM + D3) 600-800 MG-UNIT TABS   fenofibrate 54 MG tablet   hydrochlorothiazide (HYDRODIURIL) 12.5 MG tablet   levothyroxine (SYNTHROID) 25 MCG tablet   levothyroxine (SYNTHROID, LEVOTHROID) 50 MCG tablet   metFORMIN (GLUCOPHAGE-XR) 500 MG 24 hr tablet   Multiple Vitamins-Minerals (PRESERVISION AREDS 2) CAPS   tetrahydrozoline 0.05 % ophthalmic solution   valsartan (DIOVAN) 320 MG tablet   No current facility-administered medications for this encounter.       Jodell Cipro Ward, PA-C [facility] Pre-Surgical Testing [phone]

## 2023-06-19 NOTE — Anesthesia Preprocedure Evaluation (Addendum)
Anesthesia Evaluation  Patient identified by MRN, date of birth, ID band Patient awake    Reviewed: Allergy & Precautions, NPO status , Patient's Chart, lab work & pertinent test results  History of Anesthesia Complications (+) PONV and history of anesthetic complications  Airway Mallampati: II  TM Distance: >3 FB Neck ROM: Full    Dental no notable dental hx. (+) Teeth Intact, Dental Advisory Given   Pulmonary neg pulmonary ROS   Pulmonary exam normal breath sounds clear to auscultation       Cardiovascular hypertension, Pt. on medications + CAD  Normal cardiovascular exam Rhythm:Regular Rate:Normal  02/26/2023 Echo 1. Left ventricular ejection fraction, by estimation, is 55 to 60%. The  left ventricle has normal function. The left ventricle has no regional  wall motion abnormalities. Left ventricular diastolic parameters are  consistent with Grade I diastolic  dysfunction (impaired relaxation).   2. Right ventricular systolic function is normal. The right ventricular  size is normal. Tricuspid regurgitation signal is inadequate for assessing  PA pressure.   3. The mitral valve is normal in structure. Trivial mitral valve  regurgitation. No evidence of mitral stenosis.   4. The aortic valve is tricuspid. Aortic valve regurgitation is not  visualized. No aortic stenosis is present.   5. The inferior vena cava is normal in size with greater than 50%  respiratory variability, suggesting right atrial pressure of 3 mmHg.      Neuro/Psych negative neurological ROS  negative psych ROS   GI/Hepatic   Endo/Other  diabetes, Type 2, Oral Hypoglycemic AgentsHypothyroidism    Renal/GU      Musculoskeletal negative musculoskeletal ROS (+)    Abdominal   Peds  Hematology Lab Results      Component                Value               Date                      WBC                      4.4                 06/15/2023                 HGB                      13.2                06/15/2023                HCT                      40.1                06/15/2023                MCV                      92.4                06/15/2023                PLT                      197  06/15/2023              Anesthesia Other Findings All: codeine  Reproductive/Obstetrics negative OB ROS                             Anesthesia Physical Anesthesia Plan  ASA: 3  Anesthesia Plan: General   Post-op Pain Management: Tylenol PO (pre-op)*   Induction: Intravenous  PONV Risk Score and Plan: 4 or greater and Treatment may vary due to age or medical condition, Midazolam, Ondansetron, TIVA, Dexamethasone and Scopolamine patch - Pre-op  Airway Management Planned: Oral ETT  Additional Equipment: None  Intra-op Plan:   Post-operative Plan: Extubation in OR  Informed Consent: I have reviewed the patients History and Physical, chart, labs and discussed the procedure including the risks, benefits and alternatives for the proposed anesthesia with the patient or authorized representative who has indicated his/her understanding and acceptance.     Dental advisory given  Plan Discussed with: CRNA and Anesthesiologist  Anesthesia Plan Comments:         Anesthesia Quick Evaluation

## 2023-06-20 ENCOUNTER — Encounter (HOSPITAL_COMMUNITY): Payer: Self-pay | Admitting: Surgery

## 2023-06-20 DIAGNOSIS — E042 Nontoxic multinodular goiter: Secondary | ICD-10-CM | POA: Diagnosis present

## 2023-06-20 DIAGNOSIS — Z9009 Acquired absence of other part of head and neck: Secondary | ICD-10-CM | POA: Diagnosis present

## 2023-06-20 NOTE — H&P (Signed)
REFERRING PHYSICIAN: Dennie Maizes, NP  PROVIDER: Dwight Adamczak Myra Rude, MD   Chief Complaint: New Consultation (Multiple thyroid nodules)  History of Present Illness:  Patient is referred by Dennie Maizes, NP, for surgical evaluation and recommendations regarding a multinodular thyroid goiter involving the right lobe of the thyroid. Patient has a history of left thyroid lobectomy performed in 1999 by Dr. Rozetta Nunnery. Final pathology showed benign disease. Patient was started on levothyroxine at that time. Patient has recently noted development of a mild globus sensation. She denies any significant dysphagia. She does sometimes have air hunger and feels as though she is having difficulty breathing. She is able to sleep flat. Patient underwent an ultrasound in July 2024. 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 Right lobe was enlarged at 6.5 x 2.8 x 3.5 cm. There were 5 dominant nodules noted. 2 of these nodules measured 3.0 cm and biopsy was recommended. Patient underwent fine-needle biopsy of each nodule in September 2024. Final cytopathology was benign, Bethesda category II. Patient is currently taking levothyroxine. She presents today to discuss possible thyroid surgery.  Review of Systems: A complete review of systems was obtained from the patient. I have reviewed this information and discussed as appropriate with the patient. See HPI as well for other ROS.  Review of Systems  Constitutional: Negative.  HENT:  Globus sensation  Eyes: Negative.  Respiratory: Negative.  Cardiovascular: Negative.  Gastrointestinal: Negative.  Genitourinary: Negative.  Musculoskeletal: Negative.  Skin: Negative.  Neurological: Negative.  Endo/Heme/Allergies: Negative.  Psychiatric/Behavioral: Negative.    Medical History: Past Medical History:  Diagnosis Date  Asthma, unspecified asthma severity, unspecified whether complicated, unspecified whether persistent (HHS-HCC)   Diabetes mellitus without complication (CMS/HHS-HCC)  GERD (gastroesophageal reflux disease)  History of cancer  Hyperlipidemia  Hypertension  Thyroid disease   Patient Active Problem List  Diagnosis  Multiple thyroid nodules  History of lobectomy of thyroid   Past Surgical History:  Procedure Laterality Date  LAPAROSCOPIC TUBAL LIGATION  LOBECTOMY PARTIAL THYROID  TONSILLECTOMY    Allergies  Allergen Reactions  Codeine Nausea  Shellfish Derived Hives   Current Outpatient Medications on File Prior to Visit  Medication Sig Dispense Refill  antiox #8/om3/dha/epa/lut/zeax (PRESERVISION AREDS 2, OMEGA-3, ORAL) 1 capsule  aspirin 81 MG EC tablet 1 tablet Orally Once a day  atorvastatin (LIPITOR) 40 MG tablet 1 tablet Orally Once a day  fenofibrate 54 MG tablet 1 tablet with food Orally Once a day for 100 days  hydroCHLOROthiazide (HYDRODIURIL) 12.5 MG tablet 1 tablet in the morning Orally Once a day  levothyroxine (SYNTHROID) 25 MCG tablet 2 tablets in the morning on an empty stomach 4 days a week, 1 tablet 3 days a week Orally Once a day  metFORMIN (GLUCOPHAGE-XR) 500 MG XR tablet 2 tablet with a meal Orally twice a day for 90 days   No current facility-administered medications on file prior to visit.   Family History  Problem Relation Age of Onset  Coronary Artery Disease (Blocked arteries around heart) Mother  High blood pressure (Hypertension) Father  Coronary Artery Disease (Blocked arteries around heart) Father  Coronary Artery Disease (Blocked arteries around heart) Brother  High blood pressure (Hypertension) Brother    Social History   Tobacco Use  Smoking Status Never  Smokeless Tobacco Never    Social History   Socioeconomic History  Marital status: Married  Tobacco Use  Smoking status: Never  Smokeless tobacco: Never  Vaping Use  Vaping status: Never Used  Substance and Sexual Activity  Alcohol use: Not Currently  Drug use: Never   Objective:    Vitals:  BP: (!) 165/93  Pulse: 80  Temp: 36.6 C (97.9 F)  SpO2: 98%  Weight: 75.6 kg (166 lb 9.6 oz)  Height: 158.8 cm (5' 2.5")  PainSc: 0-No pain   Body mass index is 29.99 kg/m.  Physical Exam   GENERAL APPEARANCE Comfortable, no acute issues Development: normal Gross deformities: none  SKIN Rash, lesions, ulcers: none Induration, erythema: none Nodules: none palpable  EYES Conjunctiva and lids: normal Pupils: equal  EARS, NOSE, MOUTH, THROAT External ears: no lesion or deformity External nose: no lesion or deformity Hearing: grossly normal  NECK Symmetric: yes Trachea: midline Thyroid: Well-healed surgical incision in the left lower neck oriented at approximately a 45 degree angle over the head of the left clavicle. Palpation reveals no nodularity in the left thyroid bed. On the right there is a enlarged right thyroid lobe which is nodular. It is mobile with swallowing. It is nontender. It measures approximately 6 cm in greatest diameter. There is no associated lymphadenopathy.  CHEST/CV Not assessed  ABDOMEN Not assessed  GENITOURINARY/RECTAL Not assessed  MUSCULOSKELETAL Station and gait: normal Digits and nails: no clubbing or cyanosis Muscle strength: grossly normal all extremities Deformity: none  LYMPHATIC Cervical: none palpable Supraclavicular: none palpable  PSYCHIATRIC Oriented to person, place, and time: yes Mood and affect: normal for situation Judgment and insight: appropriate for situation   Assessment and Plan:   Multiple thyroid nodules History of lobectomy of thyroid  Patient is referred by her endocrinologist for surgical evaluation and recommendations regarding multiple thyroid nodules and an enlarged right thyroid lobe with mild compressive symptoms.  Patient provided with a copy of "The Thyroid Book: Medical and Surgical Treatment of Thyroid Problems", published by [employer], 16 pages. Book reviewed and explained to  patient during visit today.  Today we reviewed her clinical history. We reviewed her recent ultrasound study and the cytopathology results from her biopsies which were performed in September. Patient does have mild compressive symptoms. She describes a globus sensation. She has occasional episodes of air hunger. However she is able to sleep flat and denies any significant dysphagia.  Today we discussed completion thyroidectomy. This would involve removal of the remaining right thyroid lobe and isthmus. We discussed the risk and benefits of the procedure including the risk of recurrent laryngeal nerve injury resulting in hoarseness and the risk of injury to parathyroid glands. We discussed the hospital stay to be anticipated. We discussed the size and location of her surgical incision. We discussed her postoperative recovery. We discussed the fact that she would likely require a higher dose of thyroid hormone.  After discussion, the patient would like to continue to monitor her symptoms and the nodules in the right thyroid lobe. We will plan to repeat her ultrasound in July 2024 along with a TSH level. She will then be seen here in the office for physical exam and to discuss her ongoing symptoms. If the patient decides in the interim that she would like to proceed with surgery, she should contact our office and we will obtain any necessary studies at that time.  Patient will plan to return in July 2025. She will contact me earlier if additional symptoms develop.   Darnell Level, MD [facility] A [facility] practice Office: [phone]

## 2023-06-21 ENCOUNTER — Other Ambulatory Visit: Payer: Self-pay

## 2023-06-21 ENCOUNTER — Encounter (HOSPITAL_COMMUNITY)
Admission: RE | Disposition: A | Discharge status: Home / Self Care | Payer: Self-pay | Source: Home / Self Care | Attending: Surgery

## 2023-06-21 ENCOUNTER — Ambulatory Visit (HOSPITAL_BASED_OUTPATIENT_CLINIC_OR_DEPARTMENT_OTHER): Payer: Medicare Other | Admitting: Anesthesiology

## 2023-06-21 ENCOUNTER — Encounter (HOSPITAL_COMMUNITY): Payer: Self-pay | Admitting: Surgery

## 2023-06-21 ENCOUNTER — Ambulatory Visit (HOSPITAL_COMMUNITY)
Admission: RE | Admit: 2023-06-21 | Discharge: 2023-06-22 | Disposition: A | Discharge status: Home / Self Care | Payer: Medicare Other | Attending: Surgery | Admitting: Surgery

## 2023-06-21 ENCOUNTER — Ambulatory Visit (HOSPITAL_COMMUNITY): Payer: Medicare Other | Admitting: Physician Assistant

## 2023-06-21 DIAGNOSIS — I10 Essential (primary) hypertension: Secondary | ICD-10-CM | POA: Insufficient documentation

## 2023-06-21 DIAGNOSIS — Z9089 Acquired absence of other organs: Secondary | ICD-10-CM | POA: Diagnosis not present

## 2023-06-21 DIAGNOSIS — Z79899 Other long term (current) drug therapy: Secondary | ICD-10-CM | POA: Insufficient documentation

## 2023-06-21 DIAGNOSIS — Z79890 Hormone replacement therapy (postmenopausal): Secondary | ICD-10-CM | POA: Insufficient documentation

## 2023-06-21 DIAGNOSIS — E119 Type 2 diabetes mellitus without complications: Secondary | ICD-10-CM | POA: Insufficient documentation

## 2023-06-21 DIAGNOSIS — E039 Hypothyroidism, unspecified: Secondary | ICD-10-CM | POA: Diagnosis not present

## 2023-06-21 DIAGNOSIS — Z7982 Long term (current) use of aspirin: Secondary | ICD-10-CM | POA: Diagnosis not present

## 2023-06-21 DIAGNOSIS — Z7984 Long term (current) use of oral hypoglycemic drugs: Secondary | ICD-10-CM | POA: Insufficient documentation

## 2023-06-21 DIAGNOSIS — E042 Nontoxic multinodular goiter: Secondary | ICD-10-CM | POA: Diagnosis not present

## 2023-06-21 DIAGNOSIS — I2510 Atherosclerotic heart disease of native coronary artery without angina pectoris: Secondary | ICD-10-CM | POA: Insufficient documentation

## 2023-06-21 DIAGNOSIS — Z9009 Acquired absence of other part of head and neck: Secondary | ICD-10-CM | POA: Diagnosis present

## 2023-06-21 DIAGNOSIS — E041 Nontoxic single thyroid nodule: Secondary | ICD-10-CM | POA: Diagnosis not present

## 2023-06-21 LAB — GLUCOSE, CAPILLARY: Glucose-Capillary: 134 mg/dL — ABNORMAL HIGH (ref 70–99)

## 2023-06-21 SURGERY — THYROIDECTOMY, COMPLETION
Anesthesia: General

## 2023-06-21 MED ORDER — 0.9 % SODIUM CHLORIDE (POUR BTL) OPTIME
TOPICAL | Status: DC | PRN
  Administered 2023-06-21: 1000 mL

## 2023-06-21 MED ORDER — LACTATED RINGERS IV SOLN: INTRAVENOUS | Status: DC

## 2023-06-21 MED ORDER — ONDANSETRON HCL 4 MG/2ML IJ SOLN
4.0000 mg | Freq: Once | INTRAMUSCULAR | Status: AC | PRN
  Administered 2023-06-21: 4 mg via INTRAVENOUS

## 2023-06-21 MED ORDER — DEXAMETHASONE SODIUM PHOSPHATE 10 MG/ML IJ SOLN
INTRAMUSCULAR | Status: AC
  Filled 2023-06-21: qty 1

## 2023-06-21 MED ORDER — OXYCODONE HCL 5 MG PO TABS: 5.0000 mg | ORAL_TABLET | ORAL | Status: DC | PRN

## 2023-06-21 MED ORDER — PROPOFOL 10 MG/ML IV BOLUS
INTRAVENOUS | Status: DC | PRN
  Administered 2023-06-21: 140 mg via INTRAVENOUS

## 2023-06-21 MED ORDER — FENTANYL CITRATE (PF) 250 MCG/5ML IJ SOLN
INTRAMUSCULAR | Status: AC
  Filled 2023-06-21: qty 5

## 2023-06-21 MED ORDER — INSULIN ASPART 100 UNIT/ML IJ SOLN: 0.0000 [IU] | INTRAMUSCULAR | Status: DC | PRN

## 2023-06-21 MED ORDER — LIDOCAINE HCL (CARDIAC) PF 100 MG/5ML IV SOSY
PREFILLED_SYRINGE | INTRAVENOUS | Status: DC | PRN
  Administered 2023-06-21: 60 mg via INTRAVENOUS
  Administered 2023-06-21: 10 mg via INTRAVENOUS

## 2023-06-21 MED ORDER — ESMOLOL HCL 100 MG/10ML IV SOLN
INTRAVENOUS | Status: DC | PRN
  Administered 2023-06-21: 20 mg via INTRAVENOUS

## 2023-06-21 MED ORDER — ONDANSETRON HCL 4 MG/2ML IJ SOLN
INTRAMUSCULAR | Status: AC
Start: 2023-06-21 — End: ?
  Filled 2023-06-21: qty 2

## 2023-06-21 MED ORDER — ONDANSETRON HCL 4 MG/2ML IJ SOLN
INTRAMUSCULAR | Status: DC | PRN
  Administered 2023-06-21: 4 mg via INTRAVENOUS

## 2023-06-21 MED ORDER — HYDROCHLOROTHIAZIDE 12.5 MG PO TABS
12.5000 mg | ORAL_TABLET | Freq: Every day | ORAL | Status: DC
  Administered 2023-06-22: 12.5 mg via ORAL
  Filled 2023-06-21: qty 1

## 2023-06-21 MED ORDER — DEXAMETHASONE SODIUM PHOSPHATE 10 MG/ML IJ SOLN
INTRAMUSCULAR | Status: DC | PRN
  Administered 2023-06-21 (×2): 4 mg via INTRAVENOUS

## 2023-06-21 MED ORDER — ACETAMINOPHEN 10 MG/ML IV SOLN
1000.0000 mg | Freq: Once | INTRAVENOUS | Status: DC | PRN
  Administered 2023-06-21: 1000 mg via INTRAVENOUS

## 2023-06-21 MED ORDER — CEFAZOLIN SODIUM-DEXTROSE 2-4 GM/100ML-% IV SOLN
2.0000 g | INTRAVENOUS | Status: AC
Start: 2023-06-21 — End: 2023-06-21
  Administered 2023-06-21: 2 g via INTRAVENOUS
  Filled 2023-06-21: qty 100

## 2023-06-21 MED ORDER — HYDROMORPHONE HCL 1 MG/ML IJ SOLN: 1.0000 mg | INTRAMUSCULAR | Status: DC | PRN

## 2023-06-21 MED ORDER — STERILE WATER FOR IRRIGATION IR SOLN
Status: DC | PRN
  Administered 2023-06-21: 1000 mL

## 2023-06-21 MED ORDER — LEVOTHYROXINE SODIUM 25 MCG PO TABS
25.0000 ug | ORAL_TABLET | ORAL | Status: DC
  Administered 2023-06-22: 25 ug via ORAL
  Filled 2023-06-21: qty 1

## 2023-06-21 MED ORDER — EPHEDRINE SULFATE (PRESSORS) 50 MG/ML IJ SOLN
INTRAMUSCULAR | Status: DC | PRN
  Administered 2023-06-21 (×2): 5 mg via INTRAVENOUS

## 2023-06-21 MED ORDER — TRAMADOL HCL 50 MG PO TABS
50.0000 mg | ORAL_TABLET | Freq: Four times a day (QID) | ORAL | Status: DC | PRN
  Administered 2023-06-21: 50 mg via ORAL
  Filled 2023-06-21: qty 1

## 2023-06-21 MED ORDER — METFORMIN HCL ER 500 MG PO TB24
1000.0000 mg | ORAL_TABLET | Freq: Two times a day (BID) | ORAL | Status: DC
  Administered 2023-06-21 – 2023-06-22 (×2): 1000 mg via ORAL
  Filled 2023-06-21 (×2): qty 2

## 2023-06-21 MED ORDER — SCOPOLAMINE 1 MG/3DAYS TD PT72
1.0000 | MEDICATED_PATCH | Freq: Once | TRANSDERMAL | Status: DC
  Administered 2023-06-21: 1.5 mg via TRANSDERMAL
  Filled 2023-06-21: qty 1

## 2023-06-21 MED ORDER — PHENYLEPHRINE 80 MCG/ML (10ML) SYRINGE FOR IV PUSH (FOR BLOOD PRESSURE SUPPORT)
PREFILLED_SYRINGE | INTRAVENOUS | Status: AC
  Filled 2023-06-21: qty 10

## 2023-06-21 MED ORDER — ONDANSETRON HCL 4 MG/2ML IJ SOLN
INTRAMUSCULAR | Status: AC
  Filled 2023-06-21: qty 2

## 2023-06-21 MED ORDER — FENTANYL CITRATE PF 50 MCG/ML IJ SOSY: 25.0000 ug | PREFILLED_SYRINGE | INTRAMUSCULAR | Status: DC | PRN

## 2023-06-21 MED ORDER — EPHEDRINE 5 MG/ML INJ
INTRAVENOUS | Status: AC
  Filled 2023-06-21: qty 5

## 2023-06-21 MED ORDER — ROCURONIUM BROMIDE 100 MG/10ML IV SOLN
INTRAVENOUS | Status: DC | PRN
  Administered 2023-06-21: 50 mg via INTRAVENOUS

## 2023-06-21 MED ORDER — PHENYLEPHRINE HCL (PRESSORS) 10 MG/ML IV SOLN
INTRAVENOUS | Status: DC | PRN
  Administered 2023-06-21: 80 ug via INTRAVENOUS
  Administered 2023-06-21: 40 ug via INTRAVENOUS

## 2023-06-21 MED ORDER — CHLORHEXIDINE GLUCONATE CLOTH 2 % EX PADS
6.0000 | MEDICATED_PAD | Freq: Once | CUTANEOUS | Status: DC
Start: 2023-06-21 — End: 2023-06-21

## 2023-06-21 MED ORDER — ORAL CARE MOUTH RINSE: 15.0000 mL | Freq: Once | OROMUCOSAL | Status: AC

## 2023-06-21 MED ORDER — FENTANYL CITRATE (PF) 100 MCG/2ML IJ SOLN
INTRAMUSCULAR | Status: DC | PRN
  Administered 2023-06-21 (×2): 25 ug via INTRAVENOUS
  Administered 2023-06-21: 50 ug via INTRAVENOUS
  Administered 2023-06-21: 25 ug via INTRAVENOUS
  Administered 2023-06-21: 50 ug via INTRAVENOUS
  Administered 2023-06-21: 25 ug via INTRAVENOUS

## 2023-06-21 MED ORDER — ACETAMINOPHEN 325 MG PO TABS: 650.0000 mg | ORAL_TABLET | Freq: Four times a day (QID) | ORAL | Status: DC | PRN

## 2023-06-21 MED ORDER — ONDANSETRON HCL 4 MG/2ML IJ SOLN: 4.0000 mg | Freq: Four times a day (QID) | INTRAMUSCULAR | Status: DC | PRN

## 2023-06-21 MED ORDER — HEMOSTATIC AGENTS (NO CHARGE) OPTIME
TOPICAL | Status: DC | PRN
  Administered 2023-06-21: 1 via TOPICAL

## 2023-06-21 MED ORDER — ROCURONIUM BROMIDE 10 MG/ML (PF) SYRINGE
PREFILLED_SYRINGE | INTRAVENOUS | Status: AC
  Filled 2023-06-21: qty 10

## 2023-06-21 MED ORDER — PROPOFOL 10 MG/ML IV BOLUS
INTRAVENOUS | Status: AC
  Filled 2023-06-21: qty 20

## 2023-06-21 MED ORDER — CHLORHEXIDINE GLUCONATE 0.12 % MT SOLN
15.0000 mL | Freq: Once | OROMUCOSAL | Status: AC
  Administered 2023-06-21: 15 mL via OROMUCOSAL

## 2023-06-21 MED ORDER — DEXTROSE-SODIUM CHLORIDE 5-0.9 % IV SOLN: INTRAVENOUS | Status: AC

## 2023-06-21 MED ORDER — ONDANSETRON 4 MG PO TBDP: 4.0000 mg | ORAL_TABLET | Freq: Four times a day (QID) | ORAL | Status: DC | PRN

## 2023-06-21 MED ORDER — MIDAZOLAM HCL 5 MG/5ML IJ SOLN
INTRAMUSCULAR | Status: DC | PRN
  Administered 2023-06-21 (×2): 1 mg via INTRAVENOUS

## 2023-06-21 MED ORDER — ACETAMINOPHEN 650 MG RE SUPP: 650.0000 mg | Freq: Four times a day (QID) | RECTAL | Status: DC | PRN

## 2023-06-21 MED ORDER — IRBESARTAN 150 MG PO TABS
300.0000 mg | ORAL_TABLET | Freq: Every day | ORAL | Status: DC
  Administered 2023-06-22: 300 mg via ORAL
  Filled 2023-06-21: qty 2

## 2023-06-21 MED ORDER — ACETAMINOPHEN 10 MG/ML IV SOLN
INTRAVENOUS | Status: AC
  Filled 2023-06-21: qty 100

## 2023-06-21 MED ORDER — CHLORHEXIDINE GLUCONATE CLOTH 2 % EX PADS: 6.0000 | MEDICATED_PAD | Freq: Once | CUTANEOUS | Status: DC

## 2023-06-21 MED ORDER — SUGAMMADEX SODIUM 200 MG/2ML IV SOLN
INTRAVENOUS | Status: DC | PRN
  Administered 2023-06-21: 200 mg via INTRAVENOUS

## 2023-06-21 MED ORDER — FENOFIBRATE 54 MG PO TABS
54.0000 mg | ORAL_TABLET | Freq: Every day | ORAL | Status: DC
  Administered 2023-06-22: 54 mg via ORAL
  Filled 2023-06-21: qty 1

## 2023-06-21 MED ORDER — MIDAZOLAM HCL 2 MG/2ML IJ SOLN
INTRAMUSCULAR | Status: AC
  Filled 2023-06-21: qty 2

## 2023-06-21 SURGICAL SUPPLY — 28 items
ATTRACTOMAT 16X20 MAGNETIC DRP (DRAPES) ×1 IMPLANT
BAG COUNTER SPONGE SURGICOUNT (BAG) ×1 IMPLANT
BLADE SURG 15 STRL LF DISP TIS (BLADE) ×1 IMPLANT
CHLORAPREP W/TINT 26 (MISCELLANEOUS) ×1 IMPLANT
CLIP TI MEDIUM 6 (CLIP) ×2 IMPLANT
CLIP TI WIDE RED SMALL 6 (CLIP) ×2 IMPLANT
COVER SURGICAL LIGHT HANDLE (MISCELLANEOUS) ×1 IMPLANT
DERMABOND ADVANCED .7 DNX12 (GAUZE/BANDAGES/DRESSINGS) ×1 IMPLANT
DRAPE LAPAROTOMY T 98X78 PEDS (DRAPES) ×1 IMPLANT
DRAPE UTILITY XL STRL (DRAPES) ×1 IMPLANT
ELECT PENCIL ROCKER SW 15FT (MISCELLANEOUS) ×1 IMPLANT
ELECT REM PT RETURN 15FT ADLT (MISCELLANEOUS) ×1 IMPLANT
GAUZE 4X4 16PLY ~~LOC~~+RFID DBL (SPONGE) ×1 IMPLANT
GLOVE SURG ORTHO 8.0 STRL STRW (GLOVE) ×1 IMPLANT
GOWN STRL REUS W/ TWL XL LVL3 (GOWN DISPOSABLE) ×2 IMPLANT
HEMOSTAT SURGICEL 2X4 FIBR (HEMOSTASIS) ×1 IMPLANT
ILLUMINATOR WAVEGUIDE N/F (MISCELLANEOUS) ×1 IMPLANT
KIT BASIN OR (CUSTOM PROCEDURE TRAY) ×1 IMPLANT
KIT TURNOVER KIT A (KITS) IMPLANT
PACK BASIC VI WITH GOWN DISP (CUSTOM PROCEDURE TRAY) ×1 IMPLANT
SHEARS HARMONIC 9CM CVD (BLADE) ×1 IMPLANT
SUT MNCRL AB 4-0 PS2 18 (SUTURE) ×1 IMPLANT
SUT SILK 3 0 SH 30 (SUTURE) ×1 IMPLANT
SUT VIC AB 3-0 SH 18 (SUTURE) ×2 IMPLANT
SYR BULB IRRIG 60ML STRL (SYRINGE) ×1 IMPLANT
TOWEL OR 17X26 10 PK STRL BLUE (TOWEL DISPOSABLE) ×1 IMPLANT
TOWEL OR NON WOVEN STRL DISP B (DISPOSABLE) ×1 IMPLANT
TUBING CONNECTING 10 (TUBING) ×1 IMPLANT

## 2023-06-21 NOTE — Anesthesia Procedure Notes (Signed)
Procedure Name: Intubation Date/Time: 06/21/2023 9:36 AM  Performed by: Garth Bigness, CRNAPre-anesthesia Checklist: Patient identified, Emergency Drugs available, Suction available and Patient being monitored Patient Re-evaluated:Patient Re-evaluated prior to induction Oxygen Delivery Method: Circle system utilized Preoxygenation: Pre-oxygenation with 100% oxygen Induction Type: IV induction Ventilation: Mask ventilation without difficulty Laryngoscope Size: Mac and 3 Grade View: Grade I Tube type: Oral Tube size: 7.0 mm Number of attempts: 1 Airway Equipment and Method: Stylet Placement Confirmation: ETT inserted through vocal cords under direct vision, positive ETCO2 and breath sounds checked- equal and bilateral Secured at: 23 cm Tube secured with: Tape Dental Injury: Teeth and Oropharynx as per pre-operative assessment

## 2023-06-21 NOTE — Anesthesia Postprocedure Evaluation (Signed)
Anesthesia Post Note  Patient: Karen Cordova  Procedure(s) Performed: COMPLETION OF THYROIDECTOMY     Patient location during evaluation: PACU Anesthesia Type: General Level of consciousness: awake and alert Pain management: pain level controlled Vital Signs Assessment: post-procedure vital signs reviewed and stable Respiratory status: spontaneous breathing, nonlabored ventilation, respiratory function stable and patient connected to nasal cannula oxygen Cardiovascular status: blood pressure returned to baseline and stable Postop Assessment: no apparent nausea or vomiting Anesthetic complications: no   No notable events documented.  Last Vitals:  Vitals:   06/21/23 1155 06/21/23 1318  BP: (!) 159/73 135/67  Pulse: 61 65  Resp: 16 15  Temp: 36.4 C 36.5 C  SpO2: 96% 94%    Last Pain:  Vitals:   06/21/23 1318  TempSrc: Oral  PainSc:                  Trevor Iha

## 2023-06-21 NOTE — Discharge Instructions (Signed)

## 2023-06-21 NOTE — Interval H&P Note (Signed)
History and Physical Interval Note:  06/21/2023 9:05 AM  Karen Cordova  has presented today for surgery, with the diagnosis of MULTPLE THYROID NODULES.  The various methods of treatment have been discussed with the patient and family. After consideration of risks, benefits and other options for treatment, the patient has consented to    Procedure(s): COMPLETION OF THYROIDECTOMY (N/A) as a surgical intervention.    The patient's history has been reviewed, patient examined, no change in status, stable for surgery.  I have reviewed the patient's chart and labs.  Questions were answered to the patient's satisfaction.    Darnell Level, MD [facility] practice Office: [phone]   Darnell Level

## 2023-06-21 NOTE — Transfer of Care (Signed)
Immediate Anesthesia Transfer of Care Note  Patient: Karen Cordova  Procedure(s) Performed: COMPLETION OF THYROIDECTOMY  Patient Location: PACU  Anesthesia Type:General  Level of Consciousness: awake, alert , oriented, and patient cooperative  Airway & Oxygen Therapy: Patient Spontanous Breathing and Patient connected to face mask oxygen  Post-op Assessment: Report given to RN and Post -op Vital signs reviewed and stable  Post vital signs: Reviewed and stable  Last Vitals:  Vitals Value Taken Time  BP 137/86 06/21/23 1050  Temp    Pulse 71 06/21/23 1052  Resp 14 06/21/23 1052  SpO2 100 % 06/21/23 1052  Vitals shown include unfiled device data.  Last Pain:  Vitals:   06/21/23 1050  TempSrc:   PainSc: 0-No pain         Complications: No notable events documented.

## 2023-06-21 NOTE — Op Note (Signed)
Procedure Note  Pre-operative Diagnosis:  multinodular thyroid goiter  Post-operative Diagnosis:  same  Surgeon:  Darnell Level, MD  Assistant:  none   Procedure:  Completion thyroidectomy (right thyroid lobectomy and isthmusectomy)  Anesthesia:  General  Estimated Blood Loss:  10 cc  Drains: none         Specimen: thyroid lobe to pathology  Indications:  Patient is referred by Dennie Maizes, NP, for surgical evaluation and recommendations regarding a multinodular thyroid goiter involving the right lobe of the thyroid. Patient has a history of left thyroid lobectomy performed in 1999 by Dr. Rozetta Nunnery. Final pathology showed benign disease. Patient was started on levothyroxine at that time. Patient has recently noted development of a mild globus sensation. She denies any significant dysphagia. She does sometimes have air hunger and feels as though she is having difficulty breathing. She is able to sleep flat. Patient underwent an ultrasound in July 2024. 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 Right lobe was enlarged at 6.5 x 2.8 x 3.5 cm. There were 5 dominant nodules noted. 2 of these nodules measured 3.0 cm and biopsy was recommended. Patient underwent fine-needle biopsy of each nodule in September 2024. Final cytopathology was benign, Bethesda category II. Patient is currently taking levothyroxine. She presents today for completion thyroidectomy.  Procedure Details: Procedure was done in OR #1 at the [facility]. The patient was brought to the operating room and placed in a supine position on the operating room table. Following administration of general anesthesia, the patient was positioned and then prepped and draped in the usual aseptic fashion. After ascertaining that an adequate level of anesthesia had been achieved, a small Kocher incision was made with #15 blade. Dissection was carried through subcutaneous tissues and platysma. Hemostasis was achieved  with the electrocautery. Skin flaps were elevated cephalad and caudad from the thyroid notch to the sternal notch. A self-retaining retractor was placed for exposure. Strap muscles were incised in the midline and dissection was begun on the right side. Strap muscles were reflected laterally. The right thyroid lobe was enlarged and multinodular. The lobe was gently mobilized with blunt dissection. Superior pole vessels were dissected out and divided individually between small and medium ligaclips with the harmonic scalpel. The thyroid lobe was rolled anteriorly. Branches of the inferior thyroid artery were divided between small ligaclips with the harmonic scalpel. Inferior venous tributaries were divided between ligaclips. Both the superior and inferior parathyroid glands were identified and preserved on their vascular pedicles. The recurrent laryngeal nerve was identified and preserved along its course. The ligament of Allyson Sabal was released with the electrocautery and the gland was mobilized onto the anterior trachea. Isthmus was mobilized across the midline to the end of the tissue where it was scarred to the anterior trachea. There was no significant pyramidal lobe present. The thyroid was mobilized off of the anterior trachea with the harmonic scalpel. A suture was used to mark the isthmus margin. The thyroid lobe and isthmus were submitted to pathology for review.  The entire field was palpated for evidence of lymphadenopathy or extra-thyroidal disease.  No worrisome findings were noted.  No enlarged lymph nodes were identified.  The neck was irrigated with warm saline. Fibrillar was placed throughout the operative field. Strap muscles were approximated in the midline with interrupted 3-0 Vicryl sutures. Platysma was closed with interrupted 3-0 Vicryl sutures. Skin was closed with a running 4-0 Monocryl subcuticular suture.  Wound was washed and dried and Dermabond was  applied. The patient was awakened from  anesthesia and brought to the recovery room. The patient tolerated the procedure well.   Darnell Level, MD [facility] Office: [phone]

## 2023-06-22 ENCOUNTER — Other Ambulatory Visit: Payer: Self-pay | Admitting: Surgery

## 2023-06-22 DIAGNOSIS — E119 Type 2 diabetes mellitus without complications: Secondary | ICD-10-CM | POA: Diagnosis not present

## 2023-06-22 DIAGNOSIS — Z79899 Other long term (current) drug therapy: Secondary | ICD-10-CM | POA: Diagnosis not present

## 2023-06-22 DIAGNOSIS — Z7982 Long term (current) use of aspirin: Secondary | ICD-10-CM | POA: Diagnosis not present

## 2023-06-22 DIAGNOSIS — I2510 Atherosclerotic heart disease of native coronary artery without angina pectoris: Secondary | ICD-10-CM | POA: Diagnosis not present

## 2023-06-22 DIAGNOSIS — I10 Essential (primary) hypertension: Secondary | ICD-10-CM | POA: Diagnosis not present

## 2023-06-22 DIAGNOSIS — E039 Hypothyroidism, unspecified: Secondary | ICD-10-CM | POA: Diagnosis not present

## 2023-06-22 DIAGNOSIS — E042 Nontoxic multinodular goiter: Secondary | ICD-10-CM | POA: Diagnosis not present

## 2023-06-22 DIAGNOSIS — Z7984 Long term (current) use of oral hypoglycemic drugs: Secondary | ICD-10-CM | POA: Diagnosis not present

## 2023-06-22 DIAGNOSIS — Z9089 Acquired absence of other organs: Secondary | ICD-10-CM | POA: Diagnosis not present

## 2023-06-22 LAB — CALCIUM
Calcium: 7.7 mg/dL — ABNORMAL LOW (ref 8.9–10.3)
Calcium: 9.1 mg/dL (ref 8.9–10.3)

## 2023-06-22 MED ORDER — CALCIUM CARBONATE ANTACID 500 MG PO CHEW: 2.0000 | CHEWABLE_TABLET | Freq: Three times a day (TID) | ORAL | 1 refills | Status: AC

## 2023-06-22 MED ORDER — TRAMADOL HCL 50 MG PO TABS: 50.0000 mg | ORAL_TABLET | Freq: Four times a day (QID) | ORAL | Status: DC | PRN

## 2023-06-22 MED ORDER — CALCIUM CARBONATE 1250 (500 CA) MG PO TABS
2.0000 | ORAL_TABLET | Freq: Three times a day (TID) | ORAL | Status: DC
  Filled 2023-06-22: qty 2

## 2023-06-22 MED ORDER — CALCIUM CARBONATE 1250 (500 CA) MG PO TABS
2.0000 | ORAL_TABLET | Freq: Three times a day (TID) | ORAL | Status: DC
  Administered 2023-06-22 (×2): 2500 mg via ORAL
  Filled 2023-06-22: qty 2

## 2023-06-22 MED ORDER — LEVOTHYROXINE SODIUM 75 MCG PO TABS: 75.0000 ug | ORAL_TABLET | Freq: Every day | ORAL | 11 refills | Status: AC

## 2023-06-22 MED ORDER — CALCIUM GLUCONATE-NACL 2-0.675 GM/100ML-% IV SOLN
2.0000 g | INTRAVENOUS | Status: AC
  Administered 2023-06-22: 2000 mg via INTRAVENOUS
  Filled 2023-06-22: qty 100

## 2023-06-22 NOTE — Progress Notes (Signed)
Patient was given discharge instructions, and all questions were answered.  Patient was stable for discharge and was taken to the main exit by wheelchair. 

## 2023-06-22 NOTE — TOC CM/SW Note (Signed)
Transition of Care [facility]) - Inpatient Brief Assessment   Patient Details  Name: Karen Cordova MRN: [id number] Date of Birth: 08/10/1950  Transition of Care [facility]) CM/SW Contact:    Darleene Cleaver, LCSW Phone Number: 06/22/2023, 2:07 PM   Clinical Narrative:  Patient has a PCP and insurance.  Patient does not have any SDOH needs.  No anticipated TOC needs.  Transition of Care Asessment: Insurance and Status: Insurance coverage has been reviewed Patient has primary care physician: Yes Home environment has been reviewed: Yes lives with husband. Prior level of function:: Indep Prior/Current Home Services: No current home services Social Drivers of Health Review: SDOH reviewed no interventions necessary Readmission risk has been reviewed: Yes Transition of care needs: no transition of care needs at this time

## 2023-06-22 NOTE — Plan of Care (Signed)
?  Problem: Clinical Measurements: ?Goal: Will remain free from infection ?Outcome: Progressing ?  ?

## 2023-06-22 NOTE — Progress Notes (Signed)
    Assessment & Plan: POD#1 status post completion thyroidectomy - doing well, voice normal, minimal pain - calcium low at 7.7 - will replete with IV an PO calcium - repeat calcium level at 3PM and possible discharge        Darnell Level, MD [facility] practice Office: [phone]        Chief Complaint: Multinodular thyroid goiter  Subjective: Patient in bed, comfortable.  Denies pain, paresthesia, cramping.  Objective: Vital signs in last 24 hours: Temp:  [97.2 F (36.2 C)-98.4 F (36.9 C)] 98.3 F (36.8 C) (12/17 0513) Pulse Rate:  [61-100] 65 (12/17 0513) Resp:  [12-19] 18 (12/17 0513) BP: (133-159)/(67-96) 137/67 (12/17 0513) SpO2:  [94 %-98 %] 97 % (12/17 0513) Last BM Date : 06/21/23  Intake/Output from previous day: 12/16 0701 - 12/17 0700 In: 2260.8 [P.O.:570; I.V.:1490.8; IV Piggyback:200] Out: 1210 [Urine:1100; Emesis/NG output:100; Blood:10] Intake/Output this shift: Total I/O In: -  Out: 150 [Urine:150]  Physical Exam: HEENT - sclerae clear, mucous membranes moist Neck - wound dry and intact; mild ecchymosis; voice normal Neuro - no Chvostek's  Lab Results:  No results for input(s): "WBC", "HGB", "HCT", "PLT" in the last 72 hours. BMET Recent Labs    06/22/23 0434  CALCIUM 7.7*   PT/INR No results for input(s): "LABPROT", "INR" in the last 72 hours. Comprehensive Metabolic Panel:    Component Value Date/Time   NA 140 06/15/2023 1006   NA 142 02/11/2023 0955   NA 142 12/23/2016 0802   K 4.1 06/15/2023 1006   K 3.9 02/11/2023 0955   CL 104 06/15/2023 1006   CL 104 02/11/2023 0955   CO2 27 06/15/2023 1006   CO2 26 02/11/2023 0955   BUN 22 06/15/2023 1006   BUN 14 02/11/2023 0955   BUN 19 12/23/2016 0802   CREATININE 0.76 06/15/2023 1006   CREATININE 0.70 02/11/2023 0955   GLUCOSE 126 (H) 06/15/2023 1006   GLUCOSE 139 (H) 02/11/2023 0955   GLUCOSE 123 (H) 12/23/2016 0802   GLUCOSE 104 (H) 06/28/2006 0857    CALCIUM 7.7 (L) 06/22/2023 0434   CALCIUM 9.6 06/15/2023 1006   AST 26 06/15/2023 1006   AST 44 (H) 12/23/2016 0802   AST 44 (H) 12/23/2016 0802   ALT 21 06/15/2023 1006   ALT 15 05/25/2023 0802   ALKPHOS 88 06/15/2023 1006   ALKPHOS 89 12/23/2016 0802   ALKPHOS 89 12/23/2016 0802   BILITOT 0.7 06/15/2023 1006   BILITOT 0.6 12/23/2016 0802   BILITOT 0.6 12/23/2016 0802   PROT 7.6 06/15/2023 1006   PROT 6.8 12/23/2016 0802   PROT 6.8 12/23/2016 0802   ALBUMIN 4.3 06/15/2023 1006   ALBUMIN 4.3 12/23/2016 0802   ALBUMIN 4.3 12/23/2016 0802    Studies/Results: No results found.    Darnell Level 06/22/2023  Patient ID: Karen Cordova, female   DOB: 10-30-1950, 72 y.o.   MRN: [id number]

## 2023-06-22 NOTE — Discharge Summary (Signed)
Physician Discharge Summary   Patient ID: Karen Cordova MRN: [id number] DOB/AGE: 74/09/1950 72 y.o.  Admit date: 06/21/2023  Discharge date: 06/22/2023  Discharge Diagnoses:  Principal Problem:   Multiple thyroid nodules Active Problems:   History of lobectomy of thyroid   Multinodular goiter (nontoxic)   Discharged Condition: good  Hospital Course: Patient was admitted for observation following thyroid surgery.  Post op course was uncomplicated.  Pain was well controlled.  Tolerated diet.  Post op calcium level on morning following surgery was 7.7 mg/dl.  Patient received IV and PO supplements and the repeat calcium level was 9.1 mg/dl.  Patient was prepared for discharge home on POD#1.  Consults: None  Treatments: surgery: completion thyroidectomy  Discharge Exam: Blood pressure (!) 156/75, pulse 70, temperature 97.7 F (36.5 C), temperature source Oral, resp. rate 18, height 5' 2.5" (1.588 m), weight 73 kg, SpO2 98%. See morning progress note.  Disposition: Home  Discharge Instructions     Diet - low sodium heart healthy   Complete by: As directed    Increase activity slowly   Complete by: As directed    No dressing needed   Complete by: As directed       Allergies as of 06/22/2023       Reactions   Shrimp [shellfish Allergy] Hives   Codeine Phosphate Nausea Only        Medication List     TAKE these medications    aspirin EC 81 MG tablet Take 1 tablet (81 mg total) by mouth daily. Swallow whole.   atorvastatin 20 MG tablet Commonly known as: LIPITOR Take 1 tablet (20 mg total) by mouth daily. What changed: how much to take   Biotin 5000 MCG Tabs Take 5,000 mcg by mouth daily.   Calcium Carb-Cholecalciferol 600-800 MG-UNIT Tabs Take 1 tablet by mouth at bedtime. Bone restore with vitamin K2   calcium carbonate 500 MG chewable tablet Commonly known as: Tums Chew 2 tablets (400 mg of elemental calcium total) by mouth 3 (three) times  daily.   fenofibrate 54 MG tablet Take 1 tablet (54 mg total) by mouth daily.   hydrochlorothiazide 12.5 MG tablet Commonly known as: HYDRODIURIL Take 12.5 mg by mouth daily.   levothyroxine 25 MCG tablet Commonly known as: SYNTHROID Take 25 mcg by mouth See admin instructions. Take Tues.,Thurs., Sat.,and Sun., in the morning What changed: Another medication with the same name was changed. Make sure you understand how and when to take each.   levothyroxine 50 MCG tablet Commonly known as: SYNTHROID TAKE 1 TABLET BY MOUTH  DAILY What changed: when to take this   metFORMIN 500 MG 24 hr tablet Commonly known as: GLUCOPHAGE-XR TAKE 2 TABLETS BY MOUTH  DAILY WITH BREAKFAST What changed: when to take this   PreserVision AREDS 2 Caps Take 1 capsule by mouth 2 (two) times daily.   tetrahydrozoline 0.05 % ophthalmic solution Place 1 drop into both eyes daily as needed (Dry eye).   valsartan 320 MG tablet Commonly known as: DIOVAN Take 320 mg by mouth daily.               Discharge Care Instructions  (From admission, onward)           Start     Ordered   06/22/23 0000  No dressing needed        06/22/23 1513            Follow-up Information     Carnel Stegman,  Tawanna Cooler, MD. Schedule an appointment as soon as possible for a visit in 3 week(s).   Specialty: General Surgery Why: For wound re-check Contact information: [address] [place] [address] [phone]                 Darnell Level, MD [facility] Office: [phone]   Signed: Darnell Level 06/22/2023, 3:14 PM

## 2023-06-23 LAB — SURGICAL PATHOLOGY

## 2023-06-23 NOTE — Progress Notes (Signed)
Pathology is benign, as expected.  Good news.  Darnell Level, MD [facility] practice Office: [phone]

## 2023-07-13 DIAGNOSIS — M8580 Other specified disorders of bone density and structure, unspecified site: Secondary | ICD-10-CM | POA: Diagnosis not present

## 2023-07-13 DIAGNOSIS — E7800 Pure hypercholesterolemia, unspecified: Secondary | ICD-10-CM | POA: Diagnosis not present

## 2023-07-13 DIAGNOSIS — E890 Postprocedural hypothyroidism: Secondary | ICD-10-CM | POA: Diagnosis not present

## 2023-07-13 DIAGNOSIS — M65311 Trigger thumb, right thumb: Secondary | ICD-10-CM | POA: Diagnosis not present

## 2023-07-13 DIAGNOSIS — Z0000 Encounter for general adult medical examination without abnormal findings: Secondary | ICD-10-CM | POA: Diagnosis not present

## 2023-07-13 DIAGNOSIS — E119 Type 2 diabetes mellitus without complications: Secondary | ICD-10-CM | POA: Diagnosis not present

## 2023-07-14 ENCOUNTER — Other Ambulatory Visit: Payer: Self-pay | Admitting: Family Medicine

## 2023-07-14 DIAGNOSIS — M8580 Other specified disorders of bone density and structure, unspecified site: Secondary | ICD-10-CM

## 2023-07-28 DIAGNOSIS — U071 COVID-19: Secondary | ICD-10-CM | POA: Diagnosis not present

## 2023-08-11 DIAGNOSIS — M65311 Trigger thumb, right thumb: Secondary | ICD-10-CM | POA: Diagnosis not present

## 2023-08-24 DIAGNOSIS — E890 Postprocedural hypothyroidism: Secondary | ICD-10-CM | POA: Diagnosis not present

## 2023-10-14 DIAGNOSIS — E1165 Type 2 diabetes mellitus with hyperglycemia: Secondary | ICD-10-CM | POA: Diagnosis not present

## 2023-10-29 DIAGNOSIS — E785 Hyperlipidemia, unspecified: Secondary | ICD-10-CM | POA: Diagnosis not present

## 2023-10-29 DIAGNOSIS — I10 Essential (primary) hypertension: Secondary | ICD-10-CM | POA: Diagnosis not present

## 2023-10-29 DIAGNOSIS — E1165 Type 2 diabetes mellitus with hyperglycemia: Secondary | ICD-10-CM | POA: Diagnosis not present

## 2023-10-29 DIAGNOSIS — E039 Hypothyroidism, unspecified: Secondary | ICD-10-CM | POA: Diagnosis not present

## 2023-11-10 DIAGNOSIS — E039 Hypothyroidism, unspecified: Secondary | ICD-10-CM | POA: Diagnosis not present

## 2023-11-10 DIAGNOSIS — E785 Hyperlipidemia, unspecified: Secondary | ICD-10-CM | POA: Diagnosis not present

## 2023-11-10 DIAGNOSIS — E1165 Type 2 diabetes mellitus with hyperglycemia: Secondary | ICD-10-CM | POA: Diagnosis not present

## 2023-11-10 DIAGNOSIS — I10 Essential (primary) hypertension: Secondary | ICD-10-CM | POA: Diagnosis not present

## 2023-12-02 ENCOUNTER — Other Ambulatory Visit: Payer: Self-pay | Admitting: Cardiology

## 2023-12-02 DIAGNOSIS — E785 Hyperlipidemia, unspecified: Secondary | ICD-10-CM

## 2023-12-15 ENCOUNTER — Ambulatory Visit (INDEPENDENT_AMBULATORY_CARE_PROVIDER_SITE_OTHER)

## 2023-12-15 DIAGNOSIS — M8589 Other specified disorders of bone density and structure, multiple sites: Secondary | ICD-10-CM | POA: Diagnosis not present

## 2023-12-15 DIAGNOSIS — M8580 Other specified disorders of bone density and structure, unspecified site: Secondary | ICD-10-CM | POA: Diagnosis not present

## 2023-12-15 DIAGNOSIS — Z13820 Encounter for screening for osteoporosis: Secondary | ICD-10-CM | POA: Diagnosis not present

## 2023-12-15 DIAGNOSIS — Z780 Asymptomatic menopausal state: Secondary | ICD-10-CM | POA: Diagnosis not present

## 2023-12-22 DIAGNOSIS — K219 Gastro-esophageal reflux disease without esophagitis: Secondary | ICD-10-CM | POA: Diagnosis not present

## 2023-12-22 DIAGNOSIS — R1310 Dysphagia, unspecified: Secondary | ICD-10-CM | POA: Diagnosis not present

## 2024-01-05 DIAGNOSIS — K2930 Chronic superficial gastritis without bleeding: Secondary | ICD-10-CM | POA: Diagnosis not present

## 2024-01-05 DIAGNOSIS — K224 Dyskinesia of esophagus: Secondary | ICD-10-CM | POA: Diagnosis not present

## 2024-01-05 DIAGNOSIS — R1013 Epigastric pain: Secondary | ICD-10-CM | POA: Diagnosis not present

## 2024-01-05 DIAGNOSIS — K2970 Gastritis, unspecified, without bleeding: Secondary | ICD-10-CM | POA: Diagnosis not present

## 2024-01-05 DIAGNOSIS — K219 Gastro-esophageal reflux disease without esophagitis: Secondary | ICD-10-CM | POA: Diagnosis not present

## 2024-01-05 DIAGNOSIS — R1310 Dysphagia, unspecified: Secondary | ICD-10-CM | POA: Diagnosis not present

## 2024-01-10 DIAGNOSIS — K2930 Chronic superficial gastritis without bleeding: Secondary | ICD-10-CM | POA: Diagnosis not present

## 2024-02-01 DIAGNOSIS — E039 Hypothyroidism, unspecified: Secondary | ICD-10-CM | POA: Diagnosis not present

## 2024-02-08 DIAGNOSIS — E785 Hyperlipidemia, unspecified: Secondary | ICD-10-CM | POA: Diagnosis not present

## 2024-02-08 DIAGNOSIS — E1165 Type 2 diabetes mellitus with hyperglycemia: Secondary | ICD-10-CM | POA: Diagnosis not present

## 2024-02-08 DIAGNOSIS — E039 Hypothyroidism, unspecified: Secondary | ICD-10-CM | POA: Diagnosis not present

## 2024-02-08 DIAGNOSIS — I10 Essential (primary) hypertension: Secondary | ICD-10-CM | POA: Diagnosis not present

## 2024-03-03 ENCOUNTER — Other Ambulatory Visit: Payer: Medicare Other

## 2024-03-08 ENCOUNTER — Other Ambulatory Visit: Payer: Self-pay | Admitting: Family Medicine

## 2024-03-08 DIAGNOSIS — Z1231 Encounter for screening mammogram for malignant neoplasm of breast: Secondary | ICD-10-CM

## 2024-03-13 DIAGNOSIS — H179 Unspecified corneal scar and opacity: Secondary | ICD-10-CM | POA: Diagnosis not present

## 2024-03-13 DIAGNOSIS — H02834 Dermatochalasis of left upper eyelid: Secondary | ICD-10-CM | POA: Diagnosis not present

## 2024-03-13 DIAGNOSIS — H2513 Age-related nuclear cataract, bilateral: Secondary | ICD-10-CM | POA: Diagnosis not present

## 2024-03-13 DIAGNOSIS — H353111 Nonexudative age-related macular degeneration, right eye, early dry stage: Secondary | ICD-10-CM | POA: Diagnosis not present

## 2024-03-13 DIAGNOSIS — E119 Type 2 diabetes mellitus without complications: Secondary | ICD-10-CM | POA: Diagnosis not present

## 2024-03-13 DIAGNOSIS — Q141 Congenital malformation of retina: Secondary | ICD-10-CM | POA: Diagnosis not present

## 2024-03-13 DIAGNOSIS — H43823 Vitreomacular adhesion, bilateral: Secondary | ICD-10-CM | POA: Diagnosis not present

## 2024-03-13 DIAGNOSIS — H02831 Dermatochalasis of right upper eyelid: Secondary | ICD-10-CM | POA: Diagnosis not present

## 2024-03-13 DIAGNOSIS — H04123 Dry eye syndrome of bilateral lacrimal glands: Secondary | ICD-10-CM | POA: Diagnosis not present

## 2024-03-15 ENCOUNTER — Ambulatory Visit
Admission: RE | Admit: 2024-03-15 | Discharge: 2024-03-15 | Disposition: A | Discharge status: Home / Self Care | Source: Ambulatory Visit | Attending: Family Medicine | Admitting: Family Medicine

## 2024-03-15 DIAGNOSIS — Z1231 Encounter for screening mammogram for malignant neoplasm of breast: Secondary | ICD-10-CM | POA: Diagnosis not present

## 2024-03-27 DIAGNOSIS — Z1211 Encounter for screening for malignant neoplasm of colon: Secondary | ICD-10-CM | POA: Diagnosis not present

## 2024-03-27 DIAGNOSIS — R1319 Other dysphagia: Secondary | ICD-10-CM | POA: Diagnosis not present

## 2024-03-27 DIAGNOSIS — K219 Gastro-esophageal reflux disease without esophagitis: Secondary | ICD-10-CM | POA: Diagnosis not present

## 2024-04-12 ENCOUNTER — Other Ambulatory Visit: Payer: Self-pay | Admitting: Cardiology

## 2024-04-12 DIAGNOSIS — E785 Hyperlipidemia, unspecified: Secondary | ICD-10-CM

## 2024-07-02 ENCOUNTER — Other Ambulatory Visit: Payer: Self-pay | Admitting: Cardiology

## 2024-07-02 DIAGNOSIS — E785 Hyperlipidemia, unspecified: Secondary | ICD-10-CM

## 2024-07-31 ENCOUNTER — Other Ambulatory Visit: Payer: Self-pay | Admitting: Cardiology

## 2024-07-31 DIAGNOSIS — E785 Hyperlipidemia, unspecified: Secondary | ICD-10-CM

## 2024-08-04 NOTE — Telephone Encounter (Signed)
 Pt of Dr. Shlomo. Overdue for a followup that is scheduled for 12/11/24. Labs are also Overdue. Does Dr. Shlomo want to refill this RX? Please advise.

## 2024-12-11 ENCOUNTER — Ambulatory Visit: Admitting: Cardiology
